# Patient Record
Sex: Male | Born: 1945 | Race: Black or African American | Hispanic: No | Marital: Married | Smoking: Never smoker
Health system: Southern US, Community
[De-identification: ages and names within clinical notes are randomized; demographics above are authoritative.]

## PROBLEM LIST (undated history)

## (undated) DIAGNOSIS — D649 Anemia, unspecified: Secondary | ICD-10-CM

## (undated) DIAGNOSIS — K7460 Unspecified cirrhosis of liver: Secondary | ICD-10-CM

## (undated) DIAGNOSIS — I639 Cerebral infarction, unspecified: Secondary | ICD-10-CM

## (undated) DIAGNOSIS — I4891 Unspecified atrial fibrillation: Secondary | ICD-10-CM

## (undated) DIAGNOSIS — I219 Acute myocardial infarction, unspecified: Secondary | ICD-10-CM

## (undated) DIAGNOSIS — I509 Heart failure, unspecified: Secondary | ICD-10-CM

## (undated) DIAGNOSIS — F419 Anxiety disorder, unspecified: Secondary | ICD-10-CM

## (undated) DIAGNOSIS — R569 Unspecified convulsions: Secondary | ICD-10-CM

## (undated) DIAGNOSIS — I2510 Atherosclerotic heart disease of native coronary artery without angina pectoris: Secondary | ICD-10-CM

## (undated) DIAGNOSIS — C61 Malignant neoplasm of prostate: Secondary | ICD-10-CM

## (undated) DIAGNOSIS — I10 Essential (primary) hypertension: Secondary | ICD-10-CM

## (undated) DIAGNOSIS — E119 Type 2 diabetes mellitus without complications: Secondary | ICD-10-CM

## (undated) DIAGNOSIS — N189 Chronic kidney disease, unspecified: Secondary | ICD-10-CM

## (undated) DIAGNOSIS — E7800 Pure hypercholesterolemia, unspecified: Secondary | ICD-10-CM

## (undated) DIAGNOSIS — D509 Iron deficiency anemia, unspecified: Secondary | ICD-10-CM

## (undated) HISTORY — PX: CATARACT EXTRACTION, BILATERAL: SHX1313

## (undated) HISTORY — PX: PROSTATE BIOPSY: SHX241

## (undated) HISTORY — DX: Iron deficiency anemia, unspecified: D50.9

## (undated) HISTORY — DX: Unspecified cirrhosis of liver: K74.60

## (undated) HISTORY — DX: Acute myocardial infarction, unspecified: I21.9

## (undated) HISTORY — PX: CORONARY ARTERY BYPASS GRAFT: SHX141

## (undated) HISTORY — PX: CARDIAC CATHETERIZATION: SHX172

---

## 2006-09-09 ENCOUNTER — Emergency Department (HOSPITAL_COMMUNITY): Admission: EM | Admit: 2006-09-09 | Discharge: 2006-09-09 | Payer: Self-pay | Admitting: Emergency Medicine

## 2009-01-30 ENCOUNTER — Emergency Department (HOSPITAL_COMMUNITY): Admission: EM | Admit: 2009-01-30 | Discharge: 2009-01-30 | Payer: Self-pay | Admitting: Emergency Medicine

## 2009-05-30 ENCOUNTER — Emergency Department (HOSPITAL_COMMUNITY): Admission: EM | Admit: 2009-05-30 | Discharge: 2009-05-30 | Payer: Self-pay | Admitting: Family Medicine

## 2009-05-30 ENCOUNTER — Emergency Department (HOSPITAL_COMMUNITY): Admission: EM | Admit: 2009-05-30 | Discharge: 2009-05-30 | Payer: Self-pay | Admitting: Emergency Medicine

## 2010-09-10 LAB — POCT URINALYSIS DIP (DEVICE)
Bilirubin Urine: NEGATIVE
Ketones, ur: NEGATIVE mg/dL
Nitrite: NEGATIVE

## 2010-09-10 LAB — GLUCOSE, CAPILLARY: Glucose-Capillary: 196 mg/dL — ABNORMAL HIGH (ref 70–99)

## 2010-09-10 LAB — URINALYSIS, ROUTINE W REFLEX MICROSCOPIC
Bilirubin Urine: NEGATIVE
Ketones, ur: NEGATIVE mg/dL
Nitrite: NEGATIVE
Protein, ur: 30 mg/dL — AB
Specific Gravity, Urine: 1.014 (ref 1.005–1.030)
Urobilinogen, UA: 0.2 mg/dL (ref 0.0–1.0)

## 2010-09-10 LAB — CBC
HCT: 41.3 % (ref 39.0–52.0)
Hemoglobin: 13.8 g/dL (ref 13.0–17.0)
Platelets: 172 10*3/uL (ref 150–400)
RDW: 15.6 % — ABNORMAL HIGH (ref 11.5–15.5)
WBC: 4.2 10*3/uL (ref 4.0–10.5)

## 2010-09-10 LAB — DIFFERENTIAL
Basophils Absolute: 0 10*3/uL (ref 0.0–0.1)
Basophils Relative: 0 % (ref 0–1)
Eosinophils Absolute: 0.1 10*3/uL (ref 0.0–0.7)
Monocytes Absolute: 0.3 10*3/uL (ref 0.1–1.0)
Monocytes Relative: 6 % (ref 3–12)
Neutro Abs: 2.9 10*3/uL (ref 1.7–7.7)

## 2010-09-10 LAB — COMPREHENSIVE METABOLIC PANEL
ALT: 43 U/L (ref 0–53)
Albumin: 3.4 g/dL — ABNORMAL LOW (ref 3.5–5.2)
Alkaline Phosphatase: 145 U/L — ABNORMAL HIGH (ref 39–117)
BUN: 18 mg/dL (ref 6–23)
Chloride: 104 mEq/L (ref 96–112)
Glucose, Bld: 188 mg/dL — ABNORMAL HIGH (ref 70–99)
Potassium: 4.1 mEq/L (ref 3.5–5.1)
Sodium: 140 mEq/L (ref 135–145)
Total Bilirubin: 0.6 mg/dL (ref 0.3–1.2)
Total Protein: 7.4 g/dL (ref 6.0–8.3)

## 2010-09-15 LAB — URINALYSIS, ROUTINE W REFLEX MICROSCOPIC
Glucose, UA: NEGATIVE mg/dL
Protein, ur: NEGATIVE mg/dL
Specific Gravity, Urine: 1.014 (ref 1.005–1.030)
Urobilinogen, UA: 0.2 mg/dL (ref 0.0–1.0)

## 2010-09-15 LAB — DIFFERENTIAL
Basophils Absolute: 0 10*3/uL (ref 0.0–0.1)
Basophils Relative: 1 % (ref 0–1)
Lymphocytes Relative: 25 % (ref 12–46)
Monocytes Relative: 9 % (ref 3–12)
Neutro Abs: 2.9 10*3/uL (ref 1.7–7.7)
Neutrophils Relative %: 63 % (ref 43–77)

## 2010-09-15 LAB — CBC
HCT: 32.1 % — ABNORMAL LOW (ref 39.0–52.0)
Hemoglobin: 11.2 g/dL — ABNORMAL LOW (ref 13.0–17.0)
MCHC: 35 g/dL (ref 30.0–36.0)
MCV: 86.6 fL (ref 78.0–100.0)
Platelets: 133 10*3/uL — ABNORMAL LOW (ref 150–400)
RDW: 14.3 % (ref 11.5–15.5)

## 2010-09-15 LAB — COMPREHENSIVE METABOLIC PANEL
Albumin: 4 g/dL (ref 3.5–5.2)
Alkaline Phosphatase: 91 U/L (ref 39–117)
BUN: 74 mg/dL — ABNORMAL HIGH (ref 6–23)
Calcium: 9.7 mg/dL (ref 8.4–10.5)
Creatinine, Ser: 2.93 mg/dL — ABNORMAL HIGH (ref 0.4–1.5)
Glucose, Bld: 201 mg/dL — ABNORMAL HIGH (ref 70–99)
Potassium: 4.3 mEq/L (ref 3.5–5.1)
Total Protein: 8.5 g/dL — ABNORMAL HIGH (ref 6.0–8.3)

## 2010-09-15 LAB — GLUCOSE, CAPILLARY: Glucose-Capillary: 172 mg/dL — ABNORMAL HIGH (ref 70–99)

## 2010-10-23 NOTE — Consult Note (Signed)
NAMEMarland Kitchen  JENSEN, KILBURG NO.:  [id number]   MEDICAL RECORD NO.:  [id number]          PATIENT TYPE:  EMS   LOCATION:  [facility]                         FACILITY:  [facility]   PHYSICIAN:  Vania Rea, M.D. DATE OF BIRTH:  1946-05-31   DATE OF CONSULTATION:  01/30/2009  DATE OF DISCHARGE:                                 CONSULTATION   PHYSICIAN REQUESTING CONSULTATION:  Guy Franco, PA-C.   PRIMARY CARE PHYSICIAN:  Vida Rigger, MD, at the [facility] in [place].   REASON FOR CONSULTATION:  Dizziness and new onset atrial fibrillation.   IMPRESSION:  1. Chronic atrial fibrillation not on Coumadin therapy because of      chronic dizziness.  2. History of a posterior cerebrovascular accident with chronic      balance impairment.  3. Diabetes type 2.  4. Congestive heart failure.  5. Coronary artery disease status post coronary artery bypass      grafting.  6. Hypertension, controlled.  7. History of hyperlipidemia.  8. History of seizure disorder.  9. Chronic renal failure.   RECOMMENDATIONS:  Since patient's medical problems appear to be all  chronic and since [facility] and we are  unable to [facility] to [facility] at this time, I have  recommended discharge the patient home without any change in his  management and advised the patient to follow up with the [facility] as soon as possible as this evening or tomorrow morning.   HISTORY OF PRESENT ILLNESS:  This is a 65 year old African American  gentleman with a past history of a stroke, which he says was in the  posterior part of his brain, a history of chronic kidney disease,  chronic atrial fibrillation, diabetes, and congestive heart failure, who  lives alone but is closely cared for by his daughter, Jex Strausbaugh, as  well as his wife, who looks in on him.  The patient usually gets around  with the assistance of a cane or a walker, although  we suspect this is  due to stubbornness rather than a real ability.  His daughter reports  that patient suffers with chronic balance impairment but got  increasingly dizzy on Friday when he went into his house before the [facility]  was switched on.  The house was very hot and he became very dizzy.  She  took him around to his house for awhile until his house cooled down and  he has improved.  She also called his kidney doctor, who made some  adjustments to his medication and she feels he is improved, but his wife  brought him to the hospital today because she was concerned.  Daughter,  however, does not feel there has been a great change in this patient  over the past from his baseline.   Patient is speech-impaired because of his stroke and also has cognition  difficulties so communication is very slow and deliberate and therefore  assisted by his wife and daughter.  The patient denies any headache,  chest pain, nausea, vomiting,  vertigo, diarrhea, fever, or chills.  He  says the dizziness does not stop him from ambulating.  He simply holds  on and walks around.  He does not feel as if he is going to pass out.   PAST MEDICAL HISTORY:  As noted above.   MEDICATIONS:  1. Minoxidil 10 mg 5 tablets twice daily.  2. Toprol-XL of 200 mg half a tablet daily.  3. Simvastatin 80 mg 1 tablet at bedtime.  4. Iron sulfate 325 mg 1 tablet daily.  5. Colace 100 mg 1 capsule twice daily.  6. Aspirin 81 mg daily.  7. Amlodipine 10 mg daily.  8. Spironolactone 25 mg 1 tablet daily.  9. Losartan 100 mg 1 tablet daily.  10.Lisinopril 40 mg 1 tablet twice daily.  11.Novolin 70/30, 30 units each morning.  12.Furosemide 40 mg, half to 1 tablet twice daily.  13.Vitamin D 50,000 units monthly.  14.Carbamazepine 300 mg 2 capsules twice daily.  15.Timolol/dorzolamide ophthalmic drops 0.5/2%, one drop daily.  16.Travoprost-Z ophthalmic solution in each eye 1 drop at bedtime.  17.Brimonidine ophthalmic solution  in each eye 1 drop daily.  18.Artificial tears in each eye at 1 drop 4 times daily.  19.Dermacerin topical cream, apply twice daily as needed for dry skin.   ALLERGIES:  No known drug allergies.   SOCIAL HISTORY:  No history of alcohol, tobacco, or illicit drug use.  He is a [facility] connected.   REVIEW OF SYSTEMS:  Other than noted above, a 10-point review of systems  was unremarkable.   PHYSICAL EXAMINATION:  GENERAL:  A very pleasant, 65-year-old, African  American gentleman sitting up in bed in no acute distress.  VITAL SIGNS:  His temperature is 98.0.  His pulse is 82 and irregular.  His  respiration is 18.  His blood pressure is 115/71.  He is saturating at  98% on room air.  HEENT:  His pupils are round and equal.  Mucous membranes pink,  anicteric.  NECK:  No cervical lymphadenopathy or thyromegaly.  CHEST:  Clear to auscultation bilaterally.  CARDIOVASCULAR:  Irregularly irregular rhythm.  He is not tachycardiac.  ABDOMEN:  Soft and nontender.  There are no masses.  EXTREMITIES:  He has a trace edema bilaterally.   LABORATORY DATA:  His white count is 4.7, hemoglobin 11.2, platelets  133.  He has a normal differential on his white count.  His sodium is  138, potassium 4.3, chloride 105, CO2 26, glucose is 201, BUN 74,  creatinine 2.93, calcium 9.7.  Total protein 8.5, albumin 4.0, liver  function is unremarkable.  A fingerstick blood glucose 172.  A 2-  view chest x-ray shows mild cardiomegaly, clear lungs, and no evidence  of pleural effusion, no mass or adenopathy, no active lung disease.  Urinalysis shows clear urine with a specific gravity of 1.014 and  negative for proteins ketones, nitrites, or leukocyte esterase      Vania Rea, M.D.  Electronically Signed     LC/MEDQ  D:  01/30/2009  T:  01/30/2009  Job:  [id number]   cc:   Cipriano Mile, M.D.  Guy Franco, PA-C

## 2012-05-22 ENCOUNTER — Emergency Department (HOSPITAL_COMMUNITY): Payer: Non-veteran care

## 2012-05-22 ENCOUNTER — Inpatient Hospital Stay (HOSPITAL_COMMUNITY)
Admission: EM | Admit: 2012-05-22 | Discharge: 2012-05-24 | DRG: 871 | Disposition: A | Discharge status: Home / Self Care | Payer: Non-veteran care | Attending: Internal Medicine | Admitting: Internal Medicine

## 2012-05-22 ENCOUNTER — Other Ambulatory Visit: Payer: Self-pay

## 2012-05-22 ENCOUNTER — Encounter (HOSPITAL_COMMUNITY): Payer: Self-pay | Admitting: Emergency Medicine

## 2012-05-22 DIAGNOSIS — I9589 Other hypotension: Secondary | ICD-10-CM | POA: Diagnosis not present

## 2012-05-22 DIAGNOSIS — I129 Hypertensive chronic kidney disease with stage 1 through stage 4 chronic kidney disease, or unspecified chronic kidney disease: Secondary | ICD-10-CM | POA: Diagnosis present

## 2012-05-22 DIAGNOSIS — Z7982 Long term (current) use of aspirin: Secondary | ICD-10-CM

## 2012-05-22 DIAGNOSIS — I10 Essential (primary) hypertension: Secondary | ICD-10-CM

## 2012-05-22 DIAGNOSIS — T502X5A Adverse effect of carbonic-anhydrase inhibitors, benzothiadiazides and other diuretics, initial encounter: Secondary | ICD-10-CM | POA: Diagnosis not present

## 2012-05-22 DIAGNOSIS — J189 Pneumonia, unspecified organism: Secondary | ICD-10-CM

## 2012-05-22 DIAGNOSIS — I69928 Other speech and language deficits following unspecified cerebrovascular disease: Secondary | ICD-10-CM

## 2012-05-22 DIAGNOSIS — J1100 Influenza due to unidentified influenza virus with unspecified type of pneumonia: Secondary | ICD-10-CM | POA: Diagnosis present

## 2012-05-22 DIAGNOSIS — A419 Sepsis, unspecified organism: Principal | ICD-10-CM

## 2012-05-22 DIAGNOSIS — I509 Heart failure, unspecified: Secondary | ICD-10-CM

## 2012-05-22 DIAGNOSIS — I2510 Atherosclerotic heart disease of native coronary artery without angina pectoris: Secondary | ICD-10-CM | POA: Diagnosis present

## 2012-05-22 DIAGNOSIS — Z951 Presence of aortocoronary bypass graft: Secondary | ICD-10-CM

## 2012-05-22 DIAGNOSIS — Z79899 Other long term (current) drug therapy: Secondary | ICD-10-CM

## 2012-05-22 DIAGNOSIS — N289 Disorder of kidney and ureter, unspecified: Secondary | ICD-10-CM

## 2012-05-22 DIAGNOSIS — R0600 Dyspnea, unspecified: Secondary | ICD-10-CM

## 2012-05-22 DIAGNOSIS — E119 Type 2 diabetes mellitus without complications: Secondary | ICD-10-CM | POA: Diagnosis present

## 2012-05-22 DIAGNOSIS — N189 Chronic kidney disease, unspecified: Secondary | ICD-10-CM | POA: Diagnosis present

## 2012-05-22 HISTORY — DX: Atherosclerotic heart disease of native coronary artery without angina pectoris: I25.10

## 2012-05-22 HISTORY — DX: Unspecified atrial fibrillation: I48.91

## 2012-05-22 HISTORY — DX: Cerebral infarction, unspecified: I63.9

## 2012-05-22 HISTORY — DX: Essential (primary) hypertension: I10

## 2012-05-22 LAB — COMPREHENSIVE METABOLIC PANEL
AST: 33 U/L (ref 0–37)
Albumin: 3.4 g/dL — ABNORMAL LOW (ref 3.5–5.2)
CO2: 21 mEq/L (ref 19–32)
Calcium: 9.7 mg/dL (ref 8.4–10.5)
Creatinine, Ser: 2.12 mg/dL — ABNORMAL HIGH (ref 0.50–1.35)
GFR calc non Af Amer: 31 mL/min — ABNORMAL LOW (ref >90)
Sodium: 138 mEq/L (ref 135–145)
Total Protein: 7.7 g/dL (ref 6.0–8.3)

## 2012-05-22 MED ORDER — SODIUM CHLORIDE 0.9 % IV BOLUS (SEPSIS)
1000.0000 mL | Freq: Once | INTRAVENOUS | Status: AC
  Administered 2012-05-22: 1000 mL via INTRAVENOUS

## 2012-05-22 NOTE — ED Provider Notes (Signed)
History     CSN: [id number]  Arrival date & time 05/22/12  2220   First MD Initiated Contact with Patient 05/22/12 2231      Chief Complaint  Patient presents with  . Chest Pain    (Consider location/radiation/quality/duration/timing/severity/associated sxs/prior treatment) Patient is a 66 y.o. male presenting with chest pain. The history is provided by the patient.  Chest Pain Primary symptoms include a fever, fatigue, shortness of breath and cough. Pertinent negatives for primary symptoms include no abdominal pain, no nausea and no vomiting.  Associated symptoms include weakness.  Pertinent negatives for associated symptoms include no numbness.    patient presents with fever chest pain shortness of breath.patient was given Tylenol at home. He was shaky yesterday. He has some difficulty speaking due to previous stroke. Has a history of atrial fibrillation. He also has had some urinary frequency.   Past Medical History  Diagnosis Date  . Stroke   . Diabetes mellitus without complication   . Hypertension   . Coronary artery disease     Past Surgical History  Procedure Date  . Coronary artery bypass graft     No family history on file.  History  Substance Use Topics  . Smoking status: Never Smoker   . Smokeless tobacco: Not on file  . Alcohol Use: No      Review of Systems  Constitutional: Positive for fever, appetite change and fatigue. Negative for activity change.  HENT: Negative for neck stiffness.   Eyes: Negative for pain.  Respiratory: Positive for cough and shortness of breath. Negative for chest tightness.   Cardiovascular: Positive for chest pain. Negative for leg swelling.  Gastrointestinal: Negative for nausea, vomiting, abdominal pain and diarrhea.  Genitourinary: Negative for flank pain.  Musculoskeletal: Negative for back pain.  Skin: Negative for rash.  Neurological: Positive for weakness. Negative for numbness and headaches.   Psychiatric/Behavioral: Negative for behavioral problems.    Allergies  Review of patient's allergies indicates no known allergies.  Home Medications  No current outpatient prescriptions on file.  BP 178/109  Pulse 126  Temp 100.7 F (38.2 C) (Oral)  Resp 18  Wt 225 lb (102.059 kg)  SpO2 92%  Physical Exam  Nursing note and vitals reviewed. Constitutional: He is oriented to person, place, and time. He appears well-developed and well-nourished.  HENT:  Head: Normocephalic and atraumatic.  Eyes: EOM are normal. Pupils are equal, round, and reactive to light.  Neck: Normal range of motion. Neck supple.  Cardiovascular: Regular rhythm and normal heart sounds.   No murmur heard.      Tachycardia.   Pulmonary/Chest: Effort normal and breath sounds normal.       Mildly harsh breath sounds throughout. Tachypnea.   Abdominal: Soft. Bowel sounds are normal. He exhibits no distension and no mass. There is no tenderness. There is no rebound and no guarding.  Musculoskeletal: Normal range of motion. He exhibits no edema.  Neurological: He is alert and oriented to person, place, and time. No cranial nerve deficit.       Chronic left sided weakness.   Skin: Skin is warm and dry.  Psychiatric: He has a normal mood and affect.    ED Course  Procedures (including critical care time)  Labs Reviewed  GLUCOSE, CAPILLARY - Abnormal; Notable for the following:    Glucose-Capillary 256 (*)     All other components within normal limits  LACTIC ACID, PLASMA  COMPREHENSIVE METABOLIC PANEL  URINALYSIS, ROUTINE W REFLEX MICROSCOPIC  URINE CULTURE  CULTURE, BLOOD (ROUTINE X 2)  CULTURE, BLOOD (ROUTINE X 2)  TROPONIN I  CARBAMAZEPINE LEVEL, TOTAL  CBC WITH DIFFERENTIAL   Dg Chest Port 1 View  05/22/2012  *RADIOLOGY REPORT*  Clinical Data: Fever and cough.  PORTABLE CHEST - 1 VIEW  Comparison: 05/30/2009  Findings: Stable appearance of postoperative changes in the mediastinum.  Shallow  inspiration.  Cardiac enlargement similar previous study.  There is development of mild pulmonary vascular congestion without edema.  No blunting of costophrenic angles.  No pneumothorax.  No focal consolidation.  Mediastinal contours appear intact.  Calcification of the aorta.  IMPRESSION: Shallow inspiration.  Cardiac enlargement with mild pulmonary vascular congestion.   Original Report Authenticated By: Burman Nieves, M.D.      No diagnosis found.   Date: 05/22/2012  Rate: 125  Rhythm: atrial fibrillation  QRS Axis: normal  Intervals: afib  ST/T Wave abnormalities: nonspecific ST/T changes  Conduction Disutrbances:none  Narrative Interpretation: nonspecific ST/T Changes, tachycardia.   Old EKG Reviewed: changes noted    MDM  Patient with shortness of breath and fever. Tachypnea and cough. Xray no pneumonia. Lab work pending.   Juliet Rude. Rubin Payor, MD 05/23/12 0003

## 2012-05-22 NOTE — ED Notes (Signed)
Pt not able to urinate.  Will try again in 30 minutes. 

## 2012-05-22 NOTE — ED Notes (Signed)
Family states pt was given adult dose Tylenol PTA

## 2012-05-22 NOTE — ED Notes (Signed)
Pt alert, arrives from home, c/o chest pain and SOB, onset was several days ago, per family "he was shaky" yesterday, resp even unlabored, moist NPC noted

## 2012-05-22 NOTE — ED Notes (Signed)
IV attempted times two,  Unsucessful,  Electrical engineer charge at bedside

## 2012-05-22 NOTE — ED Notes (Signed)
Autumn RN at bedside attempting IV access

## 2012-05-23 ENCOUNTER — Encounter (HOSPITAL_COMMUNITY): Payer: Self-pay | Admitting: Internal Medicine

## 2012-05-23 DIAGNOSIS — J189 Pneumonia, unspecified organism: Secondary | ICD-10-CM

## 2012-05-23 DIAGNOSIS — N289 Disorder of kidney and ureter, unspecified: Secondary | ICD-10-CM | POA: Diagnosis present

## 2012-05-23 DIAGNOSIS — R0989 Other specified symptoms and signs involving the circulatory and respiratory systems: Secondary | ICD-10-CM

## 2012-05-23 DIAGNOSIS — I509 Heart failure, unspecified: Secondary | ICD-10-CM

## 2012-05-23 DIAGNOSIS — A419 Sepsis, unspecified organism: Principal | ICD-10-CM

## 2012-05-23 LAB — HEMOGLOBIN A1C: Hgb A1c MFr Bld: 7.8 % — ABNORMAL HIGH (ref <5.7)

## 2012-05-23 LAB — GLUCOSE, CAPILLARY
Glucose-Capillary: 271 mg/dL — ABNORMAL HIGH (ref 70–99)
Glucose-Capillary: 124 mg/dL — ABNORMAL HIGH (ref 70–99)

## 2012-05-23 LAB — CBC
Platelets: 164 10*3/uL (ref 150–400)
RBC: 4.41 MIL/uL (ref 4.22–5.81)
WBC: 6.1 10*3/uL (ref 4.0–10.5)

## 2012-05-23 LAB — URINE MICROSCOPIC-ADD ON

## 2012-05-23 LAB — URINALYSIS, ROUTINE W REFLEX MICROSCOPIC
Specific Gravity, Urine: 1.025 (ref 1.005–1.030)
Urobilinogen, UA: 0.2 mg/dL (ref 0.0–1.0)
pH: 5.5 (ref 5.0–8.0)

## 2012-05-23 LAB — INFLUENZA PANEL BY PCR (TYPE A & B): Influenza A By PCR: POSITIVE — AB

## 2012-05-23 MED ORDER — POLYVINYL ALCOHOL 1.4 % OP SOLN
1.0000 [drp] | Freq: Four times a day (QID) | OPHTHALMIC | Status: DC
  Administered 2012-05-23 – 2012-05-24 (×5): 1 [drp] via OPHTHALMIC
  Filled 2012-05-23: qty 15

## 2012-05-23 MED ORDER — DOCUSATE SODIUM 100 MG PO CAPS
100.0000 mg | ORAL_CAPSULE | Freq: Two times a day (BID) | ORAL | Status: DC | PRN
  Filled 2012-05-23: qty 1

## 2012-05-23 MED ORDER — GLUCERNA SHAKE PO LIQD
237.0000 mL | Freq: Three times a day (TID) | ORAL | Status: DC
  Administered 2012-05-23 – 2012-05-24 (×4): 237 mL via ORAL
  Filled 2012-05-23 (×5): qty 237

## 2012-05-23 MED ORDER — DEXTROSE 50 % IV SOLN: 25.0000 mL | INTRAVENOUS | Status: DC | PRN

## 2012-05-23 MED ORDER — HEPARIN SODIUM (PORCINE) 5000 UNIT/ML IJ SOLN
5000.0000 [IU] | Freq: Three times a day (TID) | INTRAMUSCULAR | Status: DC
  Administered 2012-05-23 – 2012-05-24 (×5): 5000 [IU] via SUBCUTANEOUS
  Filled 2012-05-23 (×7): qty 1

## 2012-05-23 MED ORDER — DEXTROSE-NACL 5-0.45 % IV SOLN: INTRAVENOUS | Status: DC

## 2012-05-23 MED ORDER — DEXTROSE 5 % IV SOLN
1.0000 g | Freq: Every day | INTRAVENOUS | Status: DC
  Administered 2012-05-23: 1 g via INTRAVENOUS
  Filled 2012-05-23 (×2): qty 10

## 2012-05-23 MED ORDER — LATANOPROST 0.005 % OP SOLN
1.0000 [drp] | Freq: Every day | OPHTHALMIC | Status: DC
  Administered 2012-05-23: 1 [drp] via OPHTHALMIC
  Filled 2012-05-23: qty 2.5

## 2012-05-23 MED ORDER — CARBAMAZEPINE ER 100 MG PO TB12
300.0000 mg | ORAL_TABLET | Freq: Two times a day (BID) | ORAL | Status: DC
  Administered 2012-05-23 – 2012-05-24 (×3): 300 mg via ORAL
  Filled 2012-05-23 (×4): qty 3

## 2012-05-23 MED ORDER — SODIUM CHLORIDE 0.9 % IJ SOLN: 3.0000 mL | INTRAMUSCULAR | Status: DC | PRN

## 2012-05-23 MED ORDER — CALCITRIOL 0.25 MCG PO CAPS
0.2500 ug | ORAL_CAPSULE | Freq: Every morning | ORAL | Status: DC
  Administered 2012-05-23 – 2012-05-24 (×2): 0.25 ug via ORAL
  Filled 2012-05-23 (×2): qty 1

## 2012-05-23 MED ORDER — SODIUM CHLORIDE 0.9 % IV SOLN: INTRAVENOUS | Status: DC

## 2012-05-23 MED ORDER — SODIUM CHLORIDE 0.9 % IJ SOLN
3.0000 mL | Freq: Two times a day (BID) | INTRAMUSCULAR | Status: DC
  Administered 2012-05-23 (×2): 3 mL via INTRAVENOUS

## 2012-05-23 MED ORDER — INSULIN ASPART PROT & ASPART (70-30 MIX) 100 UNIT/ML ~~LOC~~ SUSP
10.0000 [IU] | Freq: Every day | SUBCUTANEOUS | Status: DC
  Administered 2012-05-23 – 2012-05-24 (×2): 10 [IU] via SUBCUTANEOUS

## 2012-05-23 MED ORDER — INSULIN ASPART PROT & ASPART (70-30 MIX) 100 UNIT/ML ~~LOC~~ SUSP
30.0000 [IU] | Freq: Every day | SUBCUTANEOUS | Status: DC
  Administered 2012-05-23 – 2012-05-24 (×2): 30 [IU] via SUBCUTANEOUS
  Filled 2012-05-23: qty 3

## 2012-05-23 MED ORDER — OSELTAMIVIR PHOSPHATE 75 MG PO CAPS
75.0000 mg | ORAL_CAPSULE | Freq: Two times a day (BID) | ORAL | Status: DC
  Administered 2012-05-23 – 2012-05-24 (×3): 75 mg via ORAL
  Filled 2012-05-23 (×4): qty 1

## 2012-05-23 MED ORDER — HYDRALAZINE HCL 20 MG/ML IJ SOLN
10.0000 mg | Freq: Four times a day (QID) | INTRAMUSCULAR | Status: DC | PRN
  Administered 2012-05-23: 10 mg via INTRAVENOUS
  Filled 2012-05-23: qty 1

## 2012-05-23 MED ORDER — SODIUM CHLORIDE 0.9 % IJ SOLN
3.0000 mL | Freq: Two times a day (BID) | INTRAMUSCULAR | Status: DC
  Administered 2012-05-23 – 2012-05-24 (×2): 3 mL via INTRAVENOUS

## 2012-05-23 MED ORDER — LOSARTAN POTASSIUM 50 MG PO TABS
100.0000 mg | ORAL_TABLET | Freq: Every morning | ORAL | Status: DC
  Administered 2012-05-23 – 2012-05-24 (×2): 100 mg via ORAL
  Filled 2012-05-23 (×2): qty 2

## 2012-05-23 MED ORDER — FUROSEMIDE 40 MG PO TABS
40.0000 mg | ORAL_TABLET | Freq: Every morning | ORAL | Status: DC
  Filled 2012-05-23: qty 1

## 2012-05-23 MED ORDER — BIOTENE DRY MOUTH MT LIQD
15.0000 mL | Freq: Two times a day (BID) | OROMUCOSAL | Status: DC
  Administered 2012-05-23 – 2012-05-24 (×3): 15 mL via OROMUCOSAL

## 2012-05-23 MED ORDER — INSULIN REGULAR BOLUS VIA INFUSION: 0.0000 [IU] | Freq: Three times a day (TID) | INTRAVENOUS | Status: DC

## 2012-05-23 MED ORDER — INSULIN ASPART 100 UNIT/ML ~~LOC~~ SOLN
0.0000 [IU] | Freq: Three times a day (TID) | SUBCUTANEOUS | Status: DC
  Administered 2012-05-23: 2 [IU] via SUBCUTANEOUS
  Administered 2012-05-23: 18:00:00 via SUBCUTANEOUS
  Administered 2012-05-23: 5 [IU] via SUBCUTANEOUS
  Administered 2012-05-24: 1 [IU] via SUBCUTANEOUS
  Administered 2012-05-24: 3 [IU] via SUBCUTANEOUS
  Administered 2012-05-24: 2 [IU] via SUBCUTANEOUS

## 2012-05-23 MED ORDER — ACETAMINOPHEN 325 MG PO TABS
650.0000 mg | ORAL_TABLET | Freq: Four times a day (QID) | ORAL | Status: DC | PRN
  Administered 2012-05-23 (×2): 650 mg via ORAL
  Filled 2012-05-23 (×2): qty 2

## 2012-05-23 MED ORDER — CLONIDINE HCL 0.2 MG/24HR TD PTWK: 0.2000 mg | MEDICATED_PATCH | TRANSDERMAL | Status: DC

## 2012-05-23 MED ORDER — FUROSEMIDE 10 MG/ML IJ SOLN
40.0000 mg | Freq: Two times a day (BID) | INTRAMUSCULAR | Status: DC
  Administered 2012-05-23 – 2012-05-24 (×3): 40 mg via INTRAVENOUS
  Filled 2012-05-23 (×5): qty 4

## 2012-05-23 MED ORDER — HYDRALAZINE HCL 25 MG PO TABS
25.0000 mg | ORAL_TABLET | Freq: Three times a day (TID) | ORAL | Status: DC
  Administered 2012-05-23 – 2012-05-24 (×4): 25 mg via ORAL
  Filled 2012-05-23 (×6): qty 1

## 2012-05-23 MED ORDER — ATROPINE SULFATE 1 % OP SOLN
1.0000 [drp] | Freq: Two times a day (BID) | OPHTHALMIC | Status: DC
  Administered 2012-05-23 – 2012-05-24 (×3): 1 [drp] via OPHTHALMIC
  Filled 2012-05-23: qty 2

## 2012-05-23 MED ORDER — DORZOLAMIDE HCL-TIMOLOL MAL 2-0.5 % OP SOLN
1.0000 [drp] | Freq: Every day | OPHTHALMIC | Status: DC
  Administered 2012-05-23: 1 [drp] via OPHTHALMIC
  Filled 2012-05-23: qty 10

## 2012-05-23 MED ORDER — SODIUM CHLORIDE 0.9 % IV SOLN: 250.0000 mL | INTRAVENOUS | Status: DC | PRN

## 2012-05-23 MED ORDER — DEXTROSE 5 % IV SOLN
1.0000 g | Freq: Once | INTRAVENOUS | Status: AC
  Administered 2012-05-23: 1 g via INTRAVENOUS
  Filled 2012-05-23: qty 10

## 2012-05-23 MED ORDER — ATORVASTATIN CALCIUM 20 MG PO TABS
20.0000 mg | ORAL_TABLET | Freq: Every day | ORAL | Status: DC
  Administered 2012-05-23 – 2012-05-24 (×2): 20 mg via ORAL
  Filled 2012-05-23 (×2): qty 1

## 2012-05-23 MED ORDER — ASPIRIN 81 MG PO CHEW
81.0000 mg | CHEWABLE_TABLET | Freq: Every morning | ORAL | Status: DC
  Administered 2012-05-23 – 2012-05-24 (×2): 81 mg via ORAL
  Filled 2012-05-23 (×2): qty 1

## 2012-05-23 MED ORDER — METOPROLOL SUCCINATE ER 100 MG PO TB24
200.0000 mg | ORAL_TABLET | Freq: Every morning | ORAL | Status: DC
  Administered 2012-05-23 – 2012-05-24 (×2): 200 mg via ORAL
  Filled 2012-05-23 (×2): qty 2

## 2012-05-23 MED ORDER — BRIMONIDINE TARTRATE 0.2 % OP SOLN
1.0000 [drp] | Freq: Three times a day (TID) | OPHTHALMIC | Status: DC
  Administered 2012-05-23 – 2012-05-24 (×5): 1 [drp] via OPHTHALMIC
  Filled 2012-05-23: qty 5

## 2012-05-23 MED ORDER — DEXTROSE 5 % IV SOLN
500.0000 mg | Freq: Once | INTRAVENOUS | Status: AC
  Administered 2012-05-23: 500 mg via INTRAVENOUS
  Filled 2012-05-23: qty 500

## 2012-05-23 MED ORDER — DEXTROSE 5 % IV SOLN
500.0000 mg | Freq: Every day | INTRAVENOUS | Status: DC
  Administered 2012-05-23: 500 mg via INTRAVENOUS
  Filled 2012-05-23 (×2): qty 500

## 2012-05-23 MED ORDER — SODIUM CHLORIDE 0.9 % IV SOLN
INTRAVENOUS | Status: DC
  Administered 2012-05-23: 02:00:00 via INTRAVENOUS

## 2012-05-23 MED ORDER — AMLODIPINE BESYLATE 10 MG PO TABS
10.0000 mg | ORAL_TABLET | Freq: Every morning | ORAL | Status: DC
  Administered 2012-05-23 – 2012-05-24 (×2): 10 mg via ORAL
  Filled 2012-05-23 (×2): qty 1

## 2012-05-23 MED ORDER — SENNA 8.6 MG PO TABS: 1.0000 | ORAL_TABLET | Freq: Every day | ORAL | Status: DC | PRN

## 2012-05-23 MED ORDER — SPIRONOLACTONE 50 MG PO TABS
50.0000 mg | ORAL_TABLET | Freq: Two times a day (BID) | ORAL | Status: DC
  Administered 2012-05-23 – 2012-05-24 (×3): 50 mg via ORAL
  Filled 2012-05-23 (×4): qty 1

## 2012-05-23 NOTE — ED Notes (Signed)
Report to Fran RN.

## 2012-05-23 NOTE — H&P (Signed)
[facility] History and Physical  Collin Fox ZOX:[id number] DOB: [birth year] DOA: 05/22/2012  Referring physician: ED PCP: Nada Boozer, MD  Specialists: None  Chief Complaint: Shortness of breath  HPI: Collin Fox is a 66 y.o. male who presents with c/o cough, fever, chills, fatigue, SOB.  Symptoms onset 2 days ago, cough is productive and frequent.  He does have some difficulty with speaking due to a prior stroke, family states mental status is at baseline.  Nothing improves nor worsens his symptoms.  In the ED he was found to be febrile, tachycardic, tachypnic, CXR demonstrated pulmonary vascular congestion but no obvious infiltrate.  Hospitalist has been asked to admit.  Review of Systems: positive for occasional leg swelling though not that bad today, full body aches, recent sick contact (wife with URI and productive cough), 12 systems reviewed and otherwise negative.  Past Medical History  Diagnosis Date  . Stroke   . Diabetes mellitus without complication   . Hypertension   . Coronary artery disease    Past Surgical History  Procedure Date  . Coronary artery bypass graft    Social History:  reports that he has never smoked. He does not have any smokeless tobacco history on file. He reports that he does not drink alcohol. His drug history not on file.   No Known Allergies  No family history on file.  Prior to Admission medications   Medication Sig Start Date End Date Taking? Authorizing Provider  amLODipine (NORVASC) 10 MG tablet Take 10 mg by mouth every morning.   Yes Historical Provider, MD  aspirin 81 MG chewable tablet Chew 81 mg by mouth every morning.   Yes Historical Provider, MD  atropine 1 % ophthalmic solution Place 1 drop into the right eye 2 (two) times daily.    Yes Historical Provider, MD  brimonidine (ALPHAGAN) 0.2 % ophthalmic solution Place 1 drop into both eyes 3 (three) times daily.   Yes Historical Provider, MD  calcitRIOL  (ROCALTROL) 0.25 MCG capsule Take 0.25 mcg by mouth every morning.   Yes Historical Provider, MD  carbamazepine (CARBATROL) 300 MG 12 hr capsule Take 300 mg by mouth 2 (two) times daily.   Yes Historical Provider, MD  cloNIDine (CATAPRES - DOSED IN MG/24 HR) 0.2 mg/24hr patch Place 1 patch onto the skin once a week. For high blood pressure. Change patch every Wednesday   Yes Historical Provider, MD  docusate sodium (COLACE) 100 MG capsule Take 100 mg by mouth 2 (two) times daily as needed. For constipation   Yes Historical Provider, MD  dorzolamide-timolol (COSOPT) 22.3-6.8 MG/ML ophthalmic solution Place 1 drop into both eyes daily.   Yes Historical Provider, MD  furosemide (LASIX) 40 MG tablet Take 40 mg by mouth every morning.   Yes Historical Provider, MD  GARLIC PO Take 1 tablet by mouth every morning.   Yes Historical Provider, MD  hydrALAZINE (APRESOLINE) 25 MG tablet Take 25 mg by mouth 3 (three) times daily.   Yes Historical Provider, MD  hydrocerin (EUCERIN) CREA Apply 1 application topically 2 (two) times daily as needed. For dry skin   Yes Historical Provider, MD  insulin NPH-insulin regular (NOVOLIN 70/30) (70-30) 100 UNIT/ML injection Inject 10-30 Units into the skin 2 (two) times daily with a meal. Inject 30 units with breakfast and 10 units with supper   Yes Historical Provider, MD  latanoprost (XALATAN) 0.005 % ophthalmic solution Place 1 drop into both eyes at bedtime.   Yes Historical Provider, MD  losartan (COZAAR) 100 MG tablet Take 100 mg by mouth every morning.   Yes Historical Provider, MD  metoprolol (TOPROL-XL) 200 MG 24 hr tablet Take 200 mg by mouth every morning.   Yes Historical Provider, MD  polyvinyl alcohol (LIQUIFILM TEARS) 1.4 % ophthalmic solution Place 1 drop into both eyes 4 (four) times daily.   Yes Historical Provider, MD  rosuvastatin (CRESTOR) 20 MG tablet Take 10 mg by mouth at bedtime.   Yes Historical Provider, MD  senna (SENOKOT) 8.6 MG TABS Take 1 tablet  by mouth daily as needed. For constipation   Yes Historical Provider, MD  spironolactone (ALDACTONE) 25 MG tablet Take 50 mg by mouth 2 (two) times daily.   Yes Historical Provider, MD   Physical Exam: Filed Vitals:   05/23/12 0112 05/23/12 0124 05/23/12 0231 05/23/12 0414  BP:    168/88  Pulse: 78 70  91  Temp:   98.3 F (36.8 C)   TempSrc:   Oral   Resp: 29 30    Weight:      SpO2: 100% 100%  100%    General:  NAD, resting comfortably in bed Eyes: PEERLA EOMI ENT: mucous membranes moist Neck: supple w/o JVD Cardiovascular: irr, irr, w/o MRG Respiratory: Very coarse breath sounds bilaterally Abdomen: soft, nt, nd, bs+ Skin: no rash nor lesion Musculoskeletal: MAE, full ROM all 4 extremities Psychiatric: normal tone and affect Neurologic: AAOx3, grossly non-focal  Labs on Admission:  Basic Metabolic Panel:  Lab 05/22/12 4782  NA 138  K 4.4  CL 103  CO2 21  GLUCOSE 284*  BUN 32*  CREATININE 2.12*  CALCIUM 9.7  MG --  PHOS --   Liver Function Tests:  Lab 05/22/12 2242  AST 33  ALT 23  ALKPHOS 90  BILITOT 0.3  PROT 7.7  ALBUMIN 3.4*   No results found for this basename: LIPASE:5,AMYLASE:5 in the last 168 hours No results found for this basename: AMMONIA:5 in the last 168 hours CBC:  Lab 05/23/12 0115  WBC 6.1  NEUTROABS --  HGB 12.6*  HCT 39.6  MCV 89.8  PLT 164   Cardiac Enzymes:  Lab 05/22/12 2242  CKTOTAL --  CKMB --  CKMBINDEX --  TROPONINI <0.30    BNP (last 3 results) No results found for this basename: PROBNP:3 in the last 8760 hours CBG:  Lab 05/22/12 2226  GLUCAP 256*    Radiological Exams on Admission: Dg Chest Port 1 View  05/22/2012  *RADIOLOGY REPORT*  Clinical Data: Fever and cough.  PORTABLE CHEST - 1 VIEW  Comparison: 05/30/2009  Findings: Stable appearance of postoperative changes in the mediastinum.  Shallow inspiration.  Cardiac enlargement similar previous study.  There is development of mild pulmonary vascular  congestion without edema.  No blunting of costophrenic angles.  No pneumothorax.  No focal consolidation.  Mediastinal contours appear intact.  Calcification of the aorta.  IMPRESSION: Shallow inspiration.  Cardiac enlargement with mild pulmonary vascular congestion.   Original Report Authenticated By: Burman Nieves, M.D.     EKG: Independently reviewed.  Assessment/Plan Principal Problem:  *CAP (community acquired pneumonia) Active Problems:  CHF (congestive heart failure)   1. CAP - will treat patient with cap treatment + tamiflu, wife also was recently ill.  Cultures and flu PCR pending.  Tachycardia improved at this point. 2. CHF - feel that the patient is fluid overloaded at this point, with pulmonary vascular congestion identified on CXR, and a known history of CHF in the past.  Will hold his home 40mg  daily of lasix and instead gently diurese him with 40mg  IV lasix Q12H, and see how he responds to this. 3. Sepsis - due to suspected CAP given respiratory and systemic symptoms even if such not identified on CXR at this point. 4. Renal insufficiency - unclear baseline, but it is worth noting that his previous labs from 2010 did demonstrate an elevated creatinine and his family confirms that they know he has some degree of CKD at baseline.  Re check BMP in AM.  Again however given the CXR findings of pulmonary vascular congestion do not feel that he is intravascularly dry by any means.  No consultations obtained  Code Status: Full Code (must indicate code status--if unknown or must be presumed, indicate so) Family Communication: Spoke with wife and daughter in room (indicate person spoken with, if applicable, with phone number if by telephone) Disposition Plan: Admit to inpatient (indicate anticipated LOS)  Time spent: 70 min  Blythe Veach M. [facility] Pager [phone]  If 7PM-7AM, please contact night-coverage [email or web address] Password [facility] 05/23/2012, 4:42  AM

## 2012-05-23 NOTE — Treatment Plan (Signed)
Chart reviewed. Pt examined. Continue current tx. Added PT/OT/nutrition eval and glucerna

## 2012-05-23 NOTE — ED Notes (Signed)
Dr. Gardner at bedside 

## 2012-05-24 DIAGNOSIS — I10 Essential (primary) hypertension: Secondary | ICD-10-CM

## 2012-05-24 LAB — BASIC METABOLIC PANEL
CO2: 23 mEq/L (ref 19–32)
Calcium: 8.8 mg/dL (ref 8.4–10.5)
Creatinine, Ser: 2.07 mg/dL — ABNORMAL HIGH (ref 0.50–1.35)
Glucose, Bld: 192 mg/dL — ABNORMAL HIGH (ref 70–99)
Sodium: 136 mEq/L (ref 135–145)

## 2012-05-24 LAB — URINE CULTURE: Culture: NO GROWTH

## 2012-05-24 LAB — GLUCOSE, CAPILLARY: Glucose-Capillary: 226 mg/dL — ABNORMAL HIGH (ref 70–99)

## 2012-05-24 MED ORDER — LORAZEPAM 2 MG/ML IJ SOLN: 1.0000 mg | Freq: Once | INTRAMUSCULAR | Status: DC

## 2012-05-24 MED ORDER — FUROSEMIDE 20 MG PO TABS
20.0000 mg | ORAL_TABLET | Freq: Every day | ORAL | Status: DC
  Filled 2012-05-24: qty 1

## 2012-05-24 MED ORDER — VITAMINS A & D EX OINT
TOPICAL_OINTMENT | CUTANEOUS | Status: AC
  Administered 2012-05-24: 12:00:00
  Filled 2012-05-24: qty 5

## 2012-05-24 MED ORDER — OSELTAMIVIR PHOSPHATE 75 MG PO CAPS: 75.0000 mg | ORAL_CAPSULE | Freq: Two times a day (BID) | ORAL | Status: DC

## 2012-05-24 NOTE — Progress Notes (Signed)
[facility] PROGRESS NOTE  Collin Fox RUE:[id number] DOB: 1945/06/29 DOA: 05/22/2012 PCP: Nada Boozer, MD  Assessment/Plan: *CAP (community acquired pneumonia) due Flu A Active Problems:  CHF (congestive heart failure)   1. CAP due to influenza A, Flu test positive, H1N1 PCR positive. Will taper abx and continue tamiflu. tachycardia improved. Can go home on tamiflu in am.  2. CHF - feel that the patient is fluid overloaded at this point, with pulmonary vascular congestion identified on CXR, and a known history of CHF in the past. Will reduce lasix to 40mg  daily instead of 40mg  IV lasix Q12H, He is improved 3. Sepsis - due to suspected CAP given respiratory and systemic symptoms even if such not identified on CXR at this point. 4. Renal insufficiency - unclear baseline, but it is worth noting that his previous labs from 2010 did demonstrate an elevated creatinine and his family confirms that they know he has some degree of CKD at baseline. Re check BMP in AM. Again however given the CXR findings of pulmonary vascular congestion do not feel that he is intravascularly dry by any means. 5.   Hypotension : reduced lasix dose today. Hold BP meds.    Code Status: Full Code   Family Communication: none Disposition Plan: D/C in am after PT/OT eval    HPI/Subjective: Patient has no new complaints. BP has been high. No fever, chills, n/v/d noted.   Objective: Filed Vitals:   05/24/12 0000 05/24/12 0506 05/24/12 1212 05/24/12 1501  BP:  117/68 111/66 93/61  Pulse:  73  92  Temp: 98.6 F (37 C) 98.6 F (37 C)  98.4 F (36.9 C)  TempSrc:  Oral  Oral  Resp:  18  20  Height:      Weight:      SpO2:  98%  100%    Intake/Output Summary (Last 24 hours) at 05/24/12 1535 Last data filed at 05/24/12 1502  Gross per 24 hour  Intake    660 ml  Output   1625 ml  Net   -965 ml   Filed Weights   05/22/12 2224 05/23/12 0503  Weight: 102.059 kg (225 lb) 93.5 kg (206 lb 2.1 oz)     Exam:   General:  A, O x3, NAD  Cardiovascular: S1S2 reg, no m/r/g  Respiratory: CTAB, no w/r/c  Abdomen: NT, ND, BS+  Data Reviewed: Basic Metabolic Panel:  Lab 05/22/12 1478  NA 138  K 4.4  CL 103  CO2 21  GLUCOSE 284*  BUN 32*  CREATININE 2.12*  CALCIUM 9.7  MG --  PHOS --   Liver Function Tests:  Lab 05/22/12 2242  AST 33  ALT 23  ALKPHOS 90  BILITOT 0.3  PROT 7.7  ALBUMIN 3.4*   No results found for this basename: LIPASE:5,AMYLASE:5 in the last 168 hours No results found for this basename: AMMONIA:5 in the last 168 hours CBC:  Lab 05/23/12 0115  WBC 6.1  NEUTROABS --  HGB 12.6*  HCT 39.6  MCV 89.8  PLT 164   Cardiac Enzymes:  Lab 05/22/12 2242  CKTOTAL --  CKMB --  CKMBINDEX --  TROPONINI <0.30   BNP (last 3 results) No results found for this basename: PROBNP:3 in the last 8760 hours CBG:  Lab 05/24/12 1228 05/24/12 0746 05/23/12 2133 05/23/12 1653 05/23/12 1154  GLUCAP 173* 150* 124* 262* 183*    Recent Results (from the past 240 hour(s))  CULTURE, BLOOD (ROUTINE X 2)  Status: Normal (Preliminary result)   Collection Time   05/22/12 11:20 PM      Component Value Range Status Comment   Specimen Description BLOOD LEFT HAND   Final    Special Requests BOTTLES DRAWN AEROBIC AND ANAEROBIC 5CC   Final    Culture  Setup Time 05/23/2012 04:37   Final    Culture     Final    Value:        BLOOD CULTURE RECEIVED NO GROWTH TO DATE CULTURE WILL BE HELD FOR 5 DAYS BEFORE ISSUING A FINAL NEGATIVE REPORT   Report Status PENDING   Incomplete   URINE CULTURE     Status: Normal   Collection Time   05/23/12 12:12 AM      Component Value Range Status Comment   Specimen Description URINE, CATHETERIZED   Final    Special Requests NONE   Final    Culture  Setup Time 05/23/2012 04:42   Final    Colony Count NO GROWTH   Final    Culture NO GROWTH   Final    Report Status 05/24/2012 FINAL   Final   CULTURE, BLOOD (ROUTINE X 2)     Status:  Normal (Preliminary result)   Collection Time   05/23/12  1:15 AM      Component Value Range Status Comment   Specimen Description BLOOD RIGHT HAND   Final    Special Requests BOTTLES DRAWN AEROBIC AND ANAEROBIC 2CC   Final    Culture  Setup Time 05/23/2012 04:37   Final    Culture     Final    Value:        BLOOD CULTURE RECEIVED NO GROWTH TO DATE CULTURE WILL BE HELD FOR 5 DAYS BEFORE ISSUING A FINAL NEGATIVE REPORT   Report Status PENDING   Incomplete      Studies: Dg Chest Port 1 View  05/22/2012  *RADIOLOGY REPORT*  Clinical Data: Fever and cough.  PORTABLE CHEST - 1 VIEW  Comparison: 05/30/2009  Findings: Stable appearance of postoperative changes in the mediastinum.  Shallow inspiration.  Cardiac enlargement similar previous study.  There is development of mild pulmonary vascular congestion without edema.  No blunting of costophrenic angles.  No pneumothorax.  No focal consolidation.  Mediastinal contours appear intact.  Calcification of the aorta.  IMPRESSION: Shallow inspiration.  Cardiac enlargement with mild pulmonary vascular congestion.   Original Report Authenticated By: Burman Nieves, M.D.     Scheduled Meds:   . amLODipine  10 mg Oral q morning - 10a  . antiseptic oral rinse  15 mL Mouth Rinse BID  . aspirin  81 mg Oral q morning - 10a  . atorvastatin  20 mg Oral q1800  . atropine  1 drop Right Eye BID  . azithromycin  500 mg Intravenous QHS  . brimonidine  1 drop Both Eyes TID  . calcitRIOL  0.25 mcg Oral q morning - 10a  . carbamazepine  300 mg Oral BID  . cefTRIAXone (ROCEPHIN)  IV  1 g Intravenous QHS  . cloNIDine  0.2 mg Transdermal Q Wed  . dorzolamide-timolol  1 drop Both Eyes Daily  . feeding supplement  237 mL Oral TID BM  . furosemide  40 mg Intravenous Q12H  . heparin  5,000 Units Subcutaneous Q8H  . hydrALAZINE  25 mg Oral TID  . insulin aspart  0-9 Units Subcutaneous TID WC  . insulin aspart protamine-insulin aspart  10 Units Subcutaneous Q supper   .  insulin aspart protamine-insulin aspart  30 Units Subcutaneous Q breakfast  . latanoprost  1 drop Both Eyes QHS  . losartan  100 mg Oral q morning - 10a  . metoprolol  200 mg Oral q morning - 10a  . oseltamivir  75 mg Oral BID  . polyvinyl alcohol  1 drop Both Eyes QID  . sodium chloride  3 mL Intravenous Q12H  . sodium chloride  3 mL Intravenous Q12H  . spironolactone  50 mg Oral BID   Continuous Infusions:   Principal Problem:  *CAP (community acquired pneumonia) Active Problems:  CHF (congestive heart failure)  Renal insufficiency  Sepsis    Time spent: 35 min   Younique Casad V.  [facility] Pager [phone]. If 8PM-8AM, please contact night-coverage at [email or web address], password [facility] 05/24/2012, 3:35 PM  LOS: 2 days

## 2012-05-24 NOTE — Progress Notes (Signed)
INITIAL ADULT NUTRITION ASSESSMENT Date: 05/24/2012   Time: 8:41 AM  Reason for Assessment: Consult, poor PO intake  INTERVENTION: 1. Continue current diet with Glucerna shakes TID.  2. RD to follow nutrition plan of care  ASSESSMENT: Male 66 y.o.  Dx: CAP (community acquired pneumonia)  Hx:  Past Medical History  Diagnosis Date  . Stroke   . Diabetes mellitus without complication   . Hypertension   . Coronary artery disease   . Atrial fibrillation     Related Meds:     . amLODipine  10 mg Oral q morning - 10a  . antiseptic oral rinse  15 mL Mouth Rinse BID  . aspirin  81 mg Oral q morning - 10a  . atorvastatin  20 mg Oral q1800  . atropine  1 drop Right Eye BID  . azithromycin  500 mg Intravenous QHS  . brimonidine  1 drop Both Eyes TID  . calcitRIOL  0.25 mcg Oral q morning - 10a  . carbamazepine  300 mg Oral BID  . cefTRIAXone (ROCEPHIN)  IV  1 g Intravenous QHS  . cloNIDine  0.2 mg Transdermal Q Wed  . dorzolamide-timolol  1 drop Both Eyes Daily  . feeding supplement  237 mL Oral TID BM  . furosemide  40 mg Intravenous Q12H  . heparin  5,000 Units Subcutaneous Q8H  . hydrALAZINE  25 mg Oral TID  . insulin aspart  0-9 Units Subcutaneous TID WC  . insulin aspart protamine-insulin aspart  10 Units Subcutaneous Q supper  . insulin aspart protamine-insulin aspart  30 Units Subcutaneous Q breakfast  . latanoprost  1 drop Both Eyes QHS  . losartan  100 mg Oral q morning - 10a  . metoprolol  200 mg Oral q morning - 10a  . oseltamivir  75 mg Oral BID  . polyvinyl alcohol  1 drop Both Eyes QID  . sodium chloride  3 mL Intravenous Q12H  . sodium chloride  3 mL Intravenous Q12H  . spironolactone  50 mg Oral BID    Ht: 6\' 2"  (188 cm)  Wt: 206 lb 2.1 oz (93.5 kg)  Ideal Wt: 86.4 kg % Ideal Wt: 108%  Usual Wt: 94 kg % Usual Wt: 100%  Body mass index is 26.47 kg/(m^2). Patient is overweight.   Food/Nutrition Related Hx: Patient with history of poor PO intake.  His intake currently is good. He denies recent weight loss. He is eating most of his meals and drinking most of his Glucerna shakes.   Labs:  CMP     Component Value Date/Time   NA 138 05/22/2012 2242   K 4.4 05/22/2012 2242   CL 103 05/22/2012 2242   CO2 21 05/22/2012 2242   GLUCOSE 284* 05/22/2012 2242   BUN 32* 05/22/2012 2242   CREATININE 2.12* 05/22/2012 2242   CALCIUM 9.7 05/22/2012 2242   PROT 7.7 05/22/2012 2242   ALBUMIN 3.4* 05/22/2012 2242   AST 33 05/22/2012 2242   ALT 23 05/22/2012 2242   ALKPHOS 90 05/22/2012 2242   BILITOT 0.3 05/22/2012 2242   GFRNONAA 31* 05/22/2012 2242   GFRAA 35* 05/22/2012 2242    Intake/Output Summary (Last 24 hours) at 05/24/12 0842 Last data filed at 05/24/12 0810  Gross per 24 hour  Intake   1260 ml  Output   2120 ml  Net   -860 ml    Diet Order: Carb Control, 100% intake  Supplements/Tube Feeding: Glucerna shake, TID  IVF:    Estimated  Nutritional Needs:   Kcal: 2150-2300 kcal Protein: 95-110 g Fluid: 2.6 L  NUTRITION DIAGNOSIS: -Predicted suboptimal energy intake (NI-1.6).  Status: Ongoing  RELATED TO: pneumonia  AS EVIDENCE BY: recent history of decreased intake  MONITORING/EVALUATION(Goals): Patient will meet 90-100% of estimated nutrition needs  Monitor: PO intake, weight, labs  EDUCATION NEEDS: -No education needs identified at this time   [name], RD, LDN Pager #: [phone] After-Hours Pager #: [phone]   DOCUMENTATION CODES Per approved criteria  -Not Applicable    [name] [facility] 05/24/2012, 8:41 AM

## 2012-05-24 NOTE — Care Management Note (Signed)
    Page 1 of 1   05/24/2012     4:49:03 PM   CARE MANAGEMENT NOTE 05/24/2012  Patient:  Collin Fox, Collin Fox   Account Number:  [id number]  Date Initiated:  05/24/2012  Documentation initiated by:  Lanier Clam  Subjective/Objective Assessment:   admitted w/sob.pna.     Action/Plan:   From home alone.Has family support.Has pcp-Wendy Orson Aloe.Has pharmacy.   Anticipated DC Date:  05/24/2012   Anticipated DC Plan:  HOME/SELF CARE      DC Planning Services  CM consult      Choice offered to / List presented to:             Status of service:  Completed, signed off Medicare Important Message given?   (If response is "NO", the following Medicare IM given date fields will be blank) Date Medicare IM given:   Date Additional Medicare IM given:    Discharge Disposition:  HOME/SELF CARE  Per UR Regulation:    If discussed at Long Length of Stay Meetings, dates discussed:    Comments:  05/24/12 Lanier Clam RN,BSN NCM [phone] MD cancelled HH.Patient to return home, & family will stay their for a few days then he will be by himself.

## 2012-05-24 NOTE — Discharge Summary (Signed)
Physician Discharge Summary  Collin Fox ZOX:[id number] DOB: January 08, 1946 DOA: 05/22/2012  PCP: Nada Boozer, MD  Admit date: 05/22/2012 Discharge date: 05/24/2012  Recommendations for Outpatient Follow-up:  1. F/u with PCP in 1 to 2 weeks of discharge.   Discharge Diagnoses:  Principal Problem:  *CAP (community acquired pneumonia) Active Problems:  CHF (congestive heart failure)  Renal insufficiency  Sepsis  Hypertension   Discharge Condition: stable  Diet recommendation: cardiac and low salt  Filed Weights   05/22/12 2224 05/23/12 0503  Weight: 102.059 kg (225 lb) 93.5 kg (206 lb 2.1 oz)    History of present illness:   Collin Fox is a 66 y.o. male who presents with c/o cough, fever, chills, fatigue, SOB. Symptoms onset 2 days ago, cough is productive and frequent. He does have some difficulty with speaking due to a prior stroke, family states mental status is at baseline. Nothing improves nor worsens his symptoms.  In the ED he was found to be febrile, tachycardic, tachypnic, CXR demonstrated pulmonary vascular congestion but no obvious infiltrate. Hospitalist has been asked to admit.  Review of Systems: positive for occasional leg swelling though not that bad today, full body aches, recent sick contact (wife with URI and productive cough), 12 systems reviewed and otherwise negative.   Hospital Course:  *CAP (community acquired pneumonia) due Flu A  Active Problems:  CHF (congestive heart failure)  1. CAP due to influenza A, Flu test positive, H1N1 PCR positive. Patient was started on IV abx and Tamiflu. Tamiflu was continued on discharge. He plans to go home with his wife for few days and then he will contiue to live by himself.  2. CHF - It was felt that the patient was fluid overloaded at this point, with pulmonary vascular congestion identified on CXR, and a known history of CHF in the past. He was treated aggresively with Lasix 40 mg IV BID and changed to  home dose on discharge.  3. Sepsis - due to suspected CAP given respiratory and systemic symptoms even if such not identified on CXR at this point. He was stable during the stay.  4. Renal insufficiency - unclear baseline, but it is worth noting that his previous labs from 2010 did demonstrate an elevated creatinine and his family confirms that they know he has some degree of CKD at baseline. He will get another BMP prior to discharge today.  Again however given the CXR findings of pulmonary vascular congestion do not feel that he is intravascularly dry by any means. He may need nephrology f/u in the future.   5. Hypotension : Due to overdiuresis. Patient and family were happy that first time his BP was under control and did not take his usual 5 BP meds every day. They want to continue to have this at home and slowly start BP meds based on his BP at home. He was stable at the time of discharge.   Procedures: none Consultations:  none  Discharge Exam: Filed Vitals:   05/24/12 0000 05/24/12 0506 05/24/12 1212 05/24/12 1501  BP:  117/68 111/66 93/61  Pulse:  73  92  Temp: 98.6 F (37 C) 98.6 F (37 C)  98.4 F (36.9 C)  TempSrc:  Oral  Oral  Resp:  18  20  Height:      Weight:      SpO2:  98%  100%    General: A, O x 3, NAD Cardiovascular: S1S2 reg, no m/r/g Respiratory: CTAB, no w/r/c  Discharge Instructions     Medication List     As of 05/24/2012  4:25 PM    TAKE these medications         amLODipine 10 MG tablet   Commonly known as: NORVASC   Take 10 mg by mouth every morning.      aspirin 81 MG chewable tablet   Chew 81 mg by mouth every morning.      atropine 1 % ophthalmic solution   Place 1 drop into the right eye 2 (two) times daily.      brimonidine 0.2 % ophthalmic solution   Commonly known as: ALPHAGAN   Place 1 drop into both eyes 3 (three) times daily.      calcitRIOL 0.25 MCG capsule   Commonly known as: ROCALTROL   Take 0.25 mcg by mouth every  morning.      carbamazepine 300 MG 12 hr capsule   Commonly known as: CARBATROL   Take 300 mg by mouth 2 (two) times daily.      cloNIDine 0.2 mg/24hr patch   Commonly known as: CATAPRES - Dosed in mg/24 hr   Place 1 patch onto the skin once a week. For high blood pressure. Change patch every Wednesday      docusate sodium 100 MG capsule   Commonly known as: COLACE   Take 100 mg by mouth 2 (two) times daily as needed. For constipation      dorzolamide-timolol 22.3-6.8 MG/ML ophthalmic solution   Commonly known as: COSOPT   Place 1 drop into both eyes daily.      furosemide 40 MG tablet   Commonly known as: LASIX   Take 40 mg by mouth every morning.      GARLIC PO   Take 1 tablet by mouth every morning.      hydrALAZINE 25 MG tablet   Commonly known as: APRESOLINE   Take 25 mg by mouth 3 (three) times daily.      hydrocerin Crea   Apply 1 application topically 2 (two) times daily as needed. For dry skin      insulin NPH-insulin regular (70-30) 100 UNIT/ML injection   Commonly known as: NOVOLIN 70/30   Inject 10-30 Units into the skin 2 (two) times daily with a meal. Inject 30 units with breakfast and 10 units with supper      latanoprost 0.005 % ophthalmic solution   Commonly known as: XALATAN   Place 1 drop into both eyes at bedtime.      losartan 100 MG tablet   Commonly known as: COZAAR   Take 100 mg by mouth every morning.      metoprolol 200 MG 24 hr tablet   Commonly known as: TOPROL-XL   Take 200 mg by mouth every morning.      oseltamivir 75 MG capsule   Commonly known as: TAMIFLU   Take 1 capsule (75 mg total) by mouth 2 (two) times daily.      polyvinyl alcohol 1.4 % ophthalmic solution   Commonly known as: LIQUIFILM TEARS   Place 1 drop into both eyes 4 (four) times daily.      rosuvastatin 20 MG tablet   Commonly known as: CRESTOR   Take 10 mg by mouth at bedtime.      senna 8.6 MG Tabs   Commonly known as: SENOKOT   Take 1 tablet by mouth  daily as needed. For constipation      spironolactone 25 MG tablet   Commonly  known as: ALDACTONE   Take 50 mg by mouth 2 (two) times daily.           Follow-up Information    Follow up with Nada Boozer, MD.   Contact information:   [address] [place] [address] [phone]           The results of significant diagnostics from this hospitalization (including imaging, microbiology, ancillary and laboratory) are listed below for reference.    Significant Diagnostic Studies: Dg Chest Port 1 View  05/22/2012  *RADIOLOGY REPORT*  Clinical Data: Fever and cough.  PORTABLE CHEST - 1 VIEW  Comparison: 05/30/2009  Findings: Stable appearance of postoperative changes in the mediastinum.  Shallow inspiration.  Cardiac enlargement similar previous study.  There is development of mild pulmonary vascular congestion without edema.  No blunting of costophrenic angles.  No pneumothorax.  No focal consolidation.  Mediastinal contours appear intact.  Calcification of the aorta.  IMPRESSION: Shallow inspiration.  Cardiac enlargement with mild pulmonary vascular congestion.   Original Report Authenticated By: Burman Nieves, M.D.     Microbiology: Recent Results (from the past 240 hour(s))  CULTURE, BLOOD (ROUTINE X 2)     Status: Normal (Preliminary result)   Collection Time   05/22/12 11:20 PM      Component Value Range Status Comment   Specimen Description BLOOD LEFT HAND   Final    Special Requests BOTTLES DRAWN AEROBIC AND ANAEROBIC 5CC   Final    Culture  Setup Time 05/23/2012 04:37   Final    Culture     Final    Value:        BLOOD CULTURE RECEIVED NO GROWTH TO DATE CULTURE WILL BE HELD FOR 5 DAYS BEFORE ISSUING A FINAL NEGATIVE REPORT   Report Status PENDING   Incomplete   URINE CULTURE     Status: Normal   Collection Time   05/23/12 12:12 AM      Component Value Range Status Comment   Specimen Description URINE, CATHETERIZED   Final    Special Requests NONE   Final     Culture  Setup Time 05/23/2012 04:42   Final    Colony Count NO GROWTH   Final    Culture NO GROWTH   Final    Report Status 05/24/2012 FINAL   Final   CULTURE, BLOOD (ROUTINE X 2)     Status: Normal (Preliminary result)   Collection Time   05/23/12  1:15 AM      Component Value Range Status Comment   Specimen Description BLOOD RIGHT HAND   Final    Special Requests BOTTLES DRAWN AEROBIC AND ANAEROBIC 2CC   Final    Culture  Setup Time 05/23/2012 04:37   Final    Culture     Final    Value:        BLOOD CULTURE RECEIVED NO GROWTH TO DATE CULTURE WILL BE HELD FOR 5 DAYS BEFORE ISSUING A FINAL NEGATIVE REPORT   Report Status PENDING   Incomplete      Labs: Basic Metabolic Panel:  Lab 05/22/12 9147  NA 138  K 4.4  CL 103  CO2 21  GLUCOSE 284*  BUN 32*  CREATININE 2.12*  CALCIUM 9.7  MG --  PHOS --   Liver Function Tests:  Lab 05/22/12 2242  AST 33  ALT 23  ALKPHOS 90  BILITOT 0.3  PROT 7.7  ALBUMIN 3.4*   No results found for this basename: LIPASE:5,AMYLASE:5 in  the last 168 hours No results found for this basename: AMMONIA:5 in the last 168 hours CBC:  Lab 05/23/12 0115  WBC 6.1  NEUTROABS --  HGB 12.6*  HCT 39.6  MCV 89.8  PLT 164   Cardiac Enzymes:  Lab 05/22/12 2242  CKTOTAL --  CKMB --  CKMBINDEX --  TROPONINI <0.30   BNP: BNP (last 3 results) No results found for this basename: PROBNP:3 in the last 8760 hours CBG:  Lab 05/24/12 1228 05/24/12 0746 05/23/12 2133 05/23/12 1653 05/23/12 1154  GLUCAP 173* 150* 124* 262* 183*    Time coordinating discharge: 45 minutes  Signed:  Yoseph Haile V.  [facility] 05/24/2012, 4:25 PM

## 2012-05-24 NOTE — Progress Notes (Signed)
Utilization Review Completed.Collin Fox T12/15/2013   

## 2012-05-29 LAB — CULTURE, BLOOD (ROUTINE X 2): Culture: NO GROWTH

## 2014-11-13 ENCOUNTER — Ambulatory Visit (INDEPENDENT_AMBULATORY_CARE_PROVIDER_SITE_OTHER): Payer: Non-veteran care | Admitting: Emergency Medicine

## 2014-11-13 VITALS — BP 189/69 | HR 61 | Temp 98.3°F | Resp 20 | Ht 70.5 in | Wt 203.4 lb

## 2014-11-13 DIAGNOSIS — S80812A Abrasion, left lower leg, initial encounter: Secondary | ICD-10-CM

## 2014-11-13 DIAGNOSIS — I639 Cerebral infarction, unspecified: Secondary | ICD-10-CM | POA: Diagnosis not present

## 2014-11-13 DIAGNOSIS — Z23 Encounter for immunization: Secondary | ICD-10-CM

## 2014-11-13 DIAGNOSIS — H6123 Impacted cerumen, bilateral: Secondary | ICD-10-CM

## 2014-11-13 NOTE — Patient Instructions (Signed)

## 2014-11-13 NOTE — Progress Notes (Signed)
Subjective:  Patient ID: Collin Fox, male    DOB: Feb 22, 1946  Age: 69 y.o. MRN: [id number]  CC: Fall and Ear Pain   HPI Collin Fox presents  with injuries sustained in a fall from a church bus today. He has an abrasion on his left lower leg. There is some skin tearing associated with it. He is not current on tetanus.  He has decreased auditory acuity and pressure in both ears. They're concerned that he might have wax accumulation in both ears. He has no nasal congestion postnasal drainage and a cough fever chills or other complaints. Her graphics had no improvement with over-the-counter medication.  History Collin Fox has a past medical history of Stroke; Diabetes mellitus without complication; Hypertension; Coronary artery disease; Atrial fibrillation; and Heart attack.   He has past surgical history that includes Coronary artery bypass graft; Eye surgery; and Prostate surgery.   His  family history includes Diabetes in his brother; Heart disease in his brother; Hypertension in his brother.  He   reports that he has never smoked. He has never used smokeless tobacco. He reports that he does not drink alcohol or use illicit drugs.  Outpatient Prescriptions Prior to Visit  Medication Sig Dispense Refill  . amLODipine (NORVASC) 10 MG tablet Take 10 mg by mouth every morning.    Marland Kitchen aspirin 81 MG chewable tablet Chew 81 mg by mouth every morning.    Marland Kitchen atropine 1 % ophthalmic solution Place 1 drop into the right eye 2 (two) times daily.     . brimonidine (ALPHAGAN) 0.2 % ophthalmic solution Place 1 drop into both eyes 3 (three) times daily.    . calcitRIOL (ROCALTROL) 0.25 MCG capsule Take 0.25 mcg by mouth every morning.    . carbamazepine (CARBATROL) 300 MG 12 hr capsule Take 300 mg by mouth 2 (two) times daily.    . cloNIDine (CATAPRES - DOSED IN MG/24 HR) 0.2 mg/24hr patch Place 1 patch onto the skin once a week. For high blood pressure. Change patch every Wednesday    . docusate  sodium (COLACE) 100 MG capsule Take 100 mg by mouth 2 (two) times daily as needed. For constipation    . dorzolamide-timolol (COSOPT) 22.3-6.8 MG/ML ophthalmic solution Place 1 drop into both eyes daily.    . furosemide (LASIX) 40 MG tablet Take 40 mg by mouth every morning.    Marland Kitchen GARLIC PO Take 1 tablet by mouth every morning.    . hydrALAZINE (APRESOLINE) 25 MG tablet Take 25 mg by mouth 3 (three) times daily.    . hydrocerin (EUCERIN) CREA Apply 1 application topically 2 (two) times daily as needed. For dry skin    . insulin NPH-insulin regular (NOVOLIN 70/30) (70-30) 100 UNIT/ML injection Inject 10-30 Units into the skin 2 (two) times daily with a meal. Inject 30 units with breakfast and 10 units with supper    . latanoprost (XALATAN) 0.005 % ophthalmic solution Place 1 drop into both eyes at bedtime.    Marland Kitchen losartan (COZAAR) 100 MG tablet Take 100 mg by mouth every morning.    . metoprolol (TOPROL-XL) 200 MG 24 hr tablet Take 200 mg by mouth every morning.    . polyvinyl alcohol (LIQUIFILM TEARS) 1.4 % ophthalmic solution Place 1 drop into both eyes 4 (four) times daily.    . rosuvastatin (CRESTOR) 20 MG tablet Take 10 mg by mouth at bedtime.    . senna (SENOKOT) 8.6 MG TABS Take 1 tablet by mouth daily  as needed. For constipation    . spironolactone (ALDACTONE) 25 MG tablet Take 50 mg by mouth 2 (two) times daily.    Marland Kitchen oseltamivir (TAMIFLU) 75 MG capsule Take 1 capsule (75 mg total) by mouth 2 (two) times daily. 8 capsule 0   No facility-administered medications prior to visit.    History   Social History  . Marital Status: Single    Spouse Name: N/A  . Number of Children: N/A  . Years of Education: N/A   Social History Main Topics  . Smoking status: Never Smoker   . Smokeless tobacco: Never Used  . Alcohol Use: No  . Drug Use: No  . Sexual Activity: Not on file   Other Topics Concern  . None   Social History Narrative     Review of Systems  Constitutional: Negative for  fever, chills and appetite change.  HENT: Positive for ear pain and hearing loss. Negative for congestion, postnasal drip, sinus pressure and sore throat.   Eyes: Negative for pain and redness.  Respiratory: Negative for cough, shortness of breath and wheezing.   Cardiovascular: Negative for leg swelling.  Gastrointestinal: Negative for nausea, vomiting, abdominal pain, diarrhea, constipation and blood in stool.  Endocrine: Negative for polyuria.  Genitourinary: Negative for dysuria, urgency, frequency and flank pain.  Musculoskeletal: Negative for gait problem.  Skin: Positive for wound. Negative for rash.  Neurological: Negative for weakness and headaches.  Psychiatric/Behavioral: Negative for confusion and decreased concentration. The patient is not nervous/anxious.     Objective:  BP 189/69 mmHg  Pulse 61  Temp(Src) 98.3 F (36.8 C) (Oral)  Resp 20  Ht 5' 10.5" (1.791 m)  Wt 203 lb 6 oz (92.25 kg)  BMI 28.76 kg/m2  SpO2 97%  Physical Exam  Constitutional: He is oriented to person, place, and time. He appears well-developed and well-nourished. No distress.  HENT:  Head: Normocephalic and atraumatic.  Right Ear: External ear normal. A foreign body is present. Decreased hearing is noted.  Left Ear: External ear normal. A foreign body is present. Decreased hearing is noted.  Nose: Nose normal.  Eyes: Conjunctivae and EOM are normal. Pupils are equal, round, and reactive to light. No scleral icterus.  Neck: Normal range of motion. Neck supple. No tracheal deviation present.  Cardiovascular: Normal rate, regular rhythm and normal heart sounds.   Pulmonary/Chest: Effort normal. No respiratory distress. He has no wheezes. He has no rales.  Abdominal: He exhibits no mass. There is no tenderness. There is no rebound and no guarding.  Musculoskeletal: He exhibits no edema.  Lymphadenopathy:    He has no cervical adenopathy.  Neurological: He is alert and oriented to person, place,  and time. Coordination normal.  Skin: Skin is warm and dry. Abrasion and laceration noted. No rash noted.  Psychiatric: He has a normal mood and affect. His behavior is normal.      Assessment & Plan:   Collin Fox was seen today for fall and ear pain.  Diagnoses and all orders for this visit:  Cerumen impaction, bilateral  Stroke  Abrasion of left leg, initial encounter  Other orders -     Tdap vaccine greater than or equal to 7yo IM   I have discontinued Collin Fox oseltamivir. I am also having him maintain his polyvinyl alcohol, atropine, calcitRIOL, dorzolamide-timolol, hydrocerin, metoprolol, amLODipine, aspirin, brimonidine, carbamazepine, docusate sodium, furosemide, hydrALAZINE, insulin NPH-regular Human, latanoprost, losartan, rosuvastatin, senna, spironolactone, GARLIC PO, and cloNIDine.  No orders of the defined types were  placed in this encounter.    Patient had both of his ear is irrigated until clear this was done atraumatically and his auditory acuity hasn't improved. His wound was dressed. He was given a tetanus booster  Appropriate red flag conditions were discussed with the patient as well as actions that should be taken.  Patient expressed his understanding.  Follow-up: No Follow-up on file.  Roselee Culver, MD

## 2015-03-14 ENCOUNTER — Encounter (HOSPITAL_COMMUNITY): Payer: Self-pay | Admitting: Emergency Medicine

## 2015-03-14 ENCOUNTER — Emergency Department (HOSPITAL_COMMUNITY)
Admission: EM | Admit: 2015-03-14 | Discharge: 2015-03-14 | Disposition: A | Discharge status: Home / Self Care | Payer: Non-veteran care | Attending: Emergency Medicine | Admitting: Emergency Medicine

## 2015-03-14 DIAGNOSIS — I2510 Atherosclerotic heart disease of native coronary artery without angina pectoris: Secondary | ICD-10-CM | POA: Insufficient documentation

## 2015-03-14 DIAGNOSIS — E119 Type 2 diabetes mellitus without complications: Secondary | ICD-10-CM | POA: Diagnosis not present

## 2015-03-14 DIAGNOSIS — R531 Weakness: Secondary | ICD-10-CM | POA: Insufficient documentation

## 2015-03-14 DIAGNOSIS — Z8673 Personal history of transient ischemic attack (TIA), and cerebral infarction without residual deficits: Secondary | ICD-10-CM | POA: Insufficient documentation

## 2015-03-14 DIAGNOSIS — R42 Dizziness and giddiness: Secondary | ICD-10-CM | POA: Diagnosis not present

## 2015-03-14 DIAGNOSIS — Z7982 Long term (current) use of aspirin: Secondary | ICD-10-CM | POA: Insufficient documentation

## 2015-03-14 DIAGNOSIS — Z794 Long term (current) use of insulin: Secondary | ICD-10-CM | POA: Diagnosis not present

## 2015-03-14 DIAGNOSIS — I4891 Unspecified atrial fibrillation: Secondary | ICD-10-CM | POA: Diagnosis not present

## 2015-03-14 DIAGNOSIS — Z79899 Other long term (current) drug therapy: Secondary | ICD-10-CM | POA: Diagnosis not present

## 2015-03-14 DIAGNOSIS — I10 Essential (primary) hypertension: Secondary | ICD-10-CM | POA: Diagnosis not present

## 2015-03-14 DIAGNOSIS — Z951 Presence of aortocoronary bypass graft: Secondary | ICD-10-CM | POA: Insufficient documentation

## 2015-03-14 LAB — CBC WITH DIFFERENTIAL/PLATELET
Basophils Absolute: 0 10*3/uL (ref 0.0–0.1)
Basophils Relative: 1 %
Eosinophils Absolute: 0 10*3/uL (ref 0.0–0.7)
Eosinophils Relative: 1 %
HEMATOCRIT: 37.9 % — AB (ref 39.0–52.0)
HEMOGLOBIN: 12 g/dL — AB (ref 13.0–17.0)
LYMPHS ABS: 0.7 10*3/uL (ref 0.7–4.0)
LYMPHS PCT: 16 %
MCH: 28.5 pg (ref 26.0–34.0)
MCHC: 31.7 g/dL (ref 30.0–36.0)
MCV: 90 fL (ref 78.0–100.0)
MONOS PCT: 9 %
Monocytes Absolute: 0.4 10*3/uL (ref 0.1–1.0)
NEUTROS ABS: 3.3 10*3/uL (ref 1.7–7.7)
NEUTROS PCT: 73 %
Platelets: 182 10*3/uL (ref 150–400)
RBC: 4.21 MIL/uL — ABNORMAL LOW (ref 4.22–5.81)
RDW: 13.4 % (ref 11.5–15.5)
WBC: 4.4 10*3/uL (ref 4.0–10.5)

## 2015-03-14 LAB — URINALYSIS, ROUTINE W REFLEX MICROSCOPIC
Bilirubin Urine: NEGATIVE
HGB URINE DIPSTICK: NEGATIVE
Ketones, ur: NEGATIVE mg/dL
LEUKOCYTES UA: NEGATIVE
Nitrite: NEGATIVE
Protein, ur: NEGATIVE mg/dL
SPECIFIC GRAVITY, URINE: 1.017 (ref 1.005–1.030)
Urobilinogen, UA: 0.2 mg/dL (ref 0.0–1.0)
pH: 6 (ref 5.0–8.0)

## 2015-03-14 LAB — BASIC METABOLIC PANEL
Anion gap: 12 (ref 5–15)
BUN: 59 mg/dL — AB (ref 6–20)
CHLORIDE: 106 mmol/L (ref 101–111)
CO2: 22 mmol/L (ref 22–32)
CREATININE: 2.28 mg/dL — AB (ref 0.61–1.24)
Calcium: 9.7 mg/dL (ref 8.9–10.3)
GFR calc Af Amer: 32 mL/min — ABNORMAL LOW (ref >60)
GFR calc non Af Amer: 28 mL/min — ABNORMAL LOW (ref >60)
Glucose, Bld: 301 mg/dL — ABNORMAL HIGH (ref 65–99)
POTASSIUM: 4.7 mmol/L (ref 3.5–5.1)
Sodium: 140 mmol/L (ref 135–145)

## 2015-03-14 LAB — TROPONIN I: TROPONIN I: 0.03 ng/mL (ref <0.031)

## 2015-03-14 LAB — URINE MICROSCOPIC-ADD ON

## 2015-03-14 NOTE — ED Provider Notes (Signed)
CSN: [id number]     Arrival date & time 03/14/15  46 History   First MD Initiated Contact with Patient 03/14/15 1548     Chief Complaint  Patient presents with  . Weakness     (Consider location/radiation/quality/duration/timing/severity/associated sxs/prior Treatment) HPI Comments: Patient with PMH of stroke with baseline slurred speech, CAD, afib, HTN, DM presents to the ED with a chief complaint of dizziness and weakness.  Patient is here with his family, who state that this is somewhat typical for the patient.  They state that he takes lasix every other day for a week, and then every day for a week on any alternating schedule.  Family members states that when he is the the every day schedule he becomes weak, and frequently becomes dizzy, similar to today.  The exception being that today the patient fell to the ground, whereas normally, they are able to catch him.  Patient denies any injuries.  He denies any pain.    The history is provided by the patient and the spouse. No language interpreter was used.    Past Medical History  Diagnosis Date  . Stroke ([place])   . Diabetes mellitus without complication ([place])   . Hypertension   . Coronary artery disease   . Atrial fibrillation ([place])   . Heart attack [facility])    Past Surgical History  Procedure Laterality Date  . Coronary artery bypass graft    . Eye surgery    . Prostate surgery     Family History  Problem Relation Age of Onset  . Diabetes Brother   . Heart disease Brother   . Hypertension Brother    Social History  Substance Use Topics  . Smoking status: Never Smoker   . Smokeless tobacco: Never Used  . Alcohol Use: No    Review of Systems  Constitutional: Negative for fever and chills.  Respiratory: Negative for shortness of breath.   Cardiovascular: Negative for chest pain.  Gastrointestinal: Negative for nausea, vomiting, diarrhea and constipation.  Genitourinary: Negative for dysuria.  Neurological: Positive for  dizziness and weakness.  All other systems reviewed and are negative.     Allergies  Review of patient's allergies indicates no known allergies.  Home Medications   Prior to Admission medications   Medication Sig Start Date End Date Taking? Authorizing Provider  amLODipine (NORVASC) 10 MG tablet Take 10 mg by mouth every morning.    Historical Provider, MD  aspirin 81 MG chewable tablet Chew 81 mg by mouth every morning.    Historical Provider, MD  atropine 1 % ophthalmic solution Place 1 drop into the right eye 2 (two) times daily.     Historical Provider, MD  brimonidine (ALPHAGAN) 0.2 % ophthalmic solution Place 1 drop into both eyes 3 (three) times daily.    Historical Provider, MD  calcitRIOL (ROCALTROL) 0.25 MCG capsule Take 0.25 mcg by mouth every morning.    Historical Provider, MD  carbamazepine (CARBATROL) 300 MG 12 hr capsule Take 300 mg by mouth 2 (two) times daily.    Historical Provider, MD  cloNIDine (CATAPRES - DOSED IN MG/24 HR) 0.2 mg/24hr patch Place 1 patch onto the skin once a week. For high blood pressure. Change patch every Wednesday    Historical Provider, MD  docusate sodium (COLACE) 100 MG capsule Take 100 mg by mouth 2 (two) times daily as needed. For constipation    Historical Provider, MD  dorzolamide-timolol (COSOPT) 22.3-6.8 MG/ML ophthalmic solution Place 1 drop into both eyes  daily.    Historical Provider, MD  furosemide (LASIX) 40 MG tablet Take 40 mg by mouth every morning.    Historical Provider, MD  GARLIC PO Take 1 tablet by mouth every morning.    Historical Provider, MD  hydrocerin (EUCERIN) CREA Apply 1 application topically 2 (two) times daily as needed. For dry skin    Historical Provider, MD  insulin NPH-insulin regular (NOVOLIN 70/30) (70-30) 100 UNIT/ML injection Inject 10-30 Units into the skin 2 (two) times daily with a meal. Inject 30 units with breakfast and 10 units with supper    Historical Provider, MD  latanoprost (XALATAN) 0.005 %  ophthalmic solution Place 1 drop into both eyes at bedtime.    Historical Provider, MD  losartan (COZAAR) 100 MG tablet Take 100 mg by mouth every morning.    Historical Provider, MD  metoprolol (TOPROL-XL) 200 MG 24 hr tablet Take 200 mg by mouth every morning.    Historical Provider, MD  polyvinyl alcohol (LIQUIFILM TEARS) 1.4 % ophthalmic solution Place 1 drop into both eyes 4 (four) times daily.    Historical Provider, MD  rosuvastatin (CRESTOR) 20 MG tablet Take 10 mg by mouth at bedtime.    Historical Provider, MD  senna (SENOKOT) 8.6 MG TABS Take 1 tablet by mouth daily as needed. For constipation    Historical Provider, MD  spironolactone (ALDACTONE) 25 MG tablet Take 50 mg by mouth 2 (two) times daily.    Historical Provider, MD   BP 151/99 mmHg  Pulse 82  Temp(Src) 97.7 F (36.5 C) (Oral)  Resp 16  SpO2 99% Physical Exam  Constitutional: He is oriented to person, place, and time. He appears well-developed and well-nourished.  HENT:  Head: Normocephalic and atraumatic.  Eyes: Conjunctivae and EOM are normal. Pupils are equal, round, and reactive to light. Right eye exhibits no discharge. Left eye exhibits no discharge. No scleral icterus.  Neck: Normal range of motion. Neck supple. No JVD present.  Cardiovascular: Normal rate, regular rhythm and normal heart sounds.  Exam reveals no gallop and no friction rub.   No murmur heard. Pulmonary/Chest: Effort normal and breath sounds normal. No respiratory distress. He has no wheezes. He has no rales. He exhibits no tenderness.  CTAB  Abdominal: Soft. He exhibits no distension and no mass. There is no tenderness. There is no rebound and no guarding.  Musculoskeletal: Normal range of motion. He exhibits no edema or tenderness.  Neurological: He is alert and oriented to person, place, and time.  CN 3-12 intact, speech is slurred at baseline, normal ROM and strength of extremities  Skin: Skin is warm and dry.  Psychiatric: He has a normal  mood and affect. His behavior is normal. Judgment and thought content normal.  Nursing note and vitals reviewed.   ED Course  Procedures (including critical care time) Results for orders placed or performed during the hospital encounter of 03/14/15  CBC with Differential/Platelet  Result Value Ref Range   WBC 4.4 4.0 - 10.5 K/uL   RBC 4.21 (L) 4.22 - 5.81 MIL/uL   Hemoglobin 12.0 (L) 13.0 - 17.0 g/dL   HCT 37.9 (L) 39.0 - 52.0 %   MCV 90.0 78.0 - 100.0 fL   MCH 28.5 26.0 - 34.0 pg   MCHC 31.7 30.0 - 36.0 g/dL   RDW 13.4 11.5 - 15.5 %   Platelets 182 150 - 400 K/uL   Neutrophils Relative % 73 %   Neutro Abs 3.3 1.7 - 7.7 K/uL  Lymphocytes Relative 16 %   Lymphs Abs 0.7 0.7 - 4.0 K/uL   Monocytes Relative 9 %   Monocytes Absolute 0.4 0.1 - 1.0 K/uL   Eosinophils Relative 1 %   Eosinophils Absolute 0.0 0.0 - 0.7 K/uL   Basophils Relative 1 %   Basophils Absolute 0.0 0.0 - 0.1 K/uL  Basic metabolic panel  Result Value Ref Range   Sodium 140 135 - 145 mmol/L   Potassium 4.7 3.5 - 5.1 mmol/L   Chloride 106 101 - 111 mmol/L   CO2 22 22 - 32 mmol/L   Glucose, Bld 301 (H) 65 - 99 mg/dL   BUN 59 (H) 6 - 20 mg/dL   Creatinine, Ser 2.28 (H) 0.61 - 1.24 mg/dL   Calcium 9.7 8.9 - 10.3 mg/dL   GFR calc non Af Amer 28 (L) >60 mL/min   GFR calc Af Amer 32 (L) >60 mL/min   Anion gap 12 5 - 15  Urinalysis, Routine w reflex microscopic (not at [facility])  Result Value Ref Range   Color, Urine YELLOW YELLOW   APPearance CLEAR CLEAR   Specific Gravity, Urine 1.017 1.005 - 1.030   pH 6.0 5.0 - 8.0   Glucose, UA >1000 (A) NEGATIVE mg/dL   Hgb urine dipstick NEGATIVE NEGATIVE   Bilirubin Urine NEGATIVE NEGATIVE   Ketones, ur NEGATIVE NEGATIVE mg/dL   Protein, ur NEGATIVE NEGATIVE mg/dL   Urobilinogen, UA 0.2 0.0 - 1.0 mg/dL   Nitrite NEGATIVE NEGATIVE   Leukocytes, UA NEGATIVE NEGATIVE  Troponin I  Result Value Ref Range   Troponin I 0.03 <0.031 ng/mL  Urine microscopic-add on  Result  Value Ref Range   Squamous Epithelial / LPF RARE RARE   No results found.    MDM   Final diagnoses:  Weakness    Patient with weakness and dizziness.  Believe symptoms to be secondary to dehydration.  Per family, this is normal for the patient.  However, given patient's other comorbidities, will check basic labs, EKG, trop, and orthostatic VS.  Labs and workup are reassuring.  Patient seen by and discussed with Dr. Zenia Resides.  Plan for discharge to home with PCP follow-up.   Montine Circle, PA-C 03/14/15 1820

## 2015-03-14 NOTE — ED Notes (Signed)
Per [facility] pt from home , lives alone. Hx stroke. Per family pt has been weak , dizzy since this AM. Per family pt was unable to walk as usual. Pt's speech is slurred yet normal to baseline. Pt is alert and oriented x4. Pt also fell this Am , denies LOC, no head injury reported.

## 2015-03-14 NOTE — ED Notes (Signed)
Bed: GU54 Expected date:  Expected time:  Means of arrival:  Comments: EMS-weakness, ? dehdration

## 2015-03-14 NOTE — ED Provider Notes (Signed)
Medical screening examination/treatment/procedure(s) were conducted as a shared visit with non-physician practitioner(s) and myself.  I personally evaluated the patient during the encounter.   EKG Interpretation None     Patient here complaining of weakness that is usually associated after he increases his dose of Lasix. Labs here showed some worsening of his chronic renal insufficiency. He is afebrile. Family states that his eyes baseline. Will follow-up with his Dr.  Lacretia Leigh, MD 03/14/15 [phone]

## 2015-03-14 NOTE — Discharge Instructions (Signed)

## 2015-04-10 ENCOUNTER — Emergency Department (HOSPITAL_COMMUNITY): Payer: Non-veteran care

## 2015-04-10 ENCOUNTER — Emergency Department (HOSPITAL_COMMUNITY)
Admission: EM | Admit: 2015-04-10 | Discharge: 2015-04-10 | Disposition: A | Discharge status: Home / Self Care | Payer: Non-veteran care | Attending: Emergency Medicine | Admitting: Emergency Medicine

## 2015-04-10 ENCOUNTER — Encounter (HOSPITAL_COMMUNITY): Payer: Self-pay | Admitting: *Deleted

## 2015-04-10 DIAGNOSIS — I4891 Unspecified atrial fibrillation: Secondary | ICD-10-CM | POA: Diagnosis not present

## 2015-04-10 DIAGNOSIS — Z794 Long term (current) use of insulin: Secondary | ICD-10-CM | POA: Insufficient documentation

## 2015-04-10 DIAGNOSIS — I129 Hypertensive chronic kidney disease with stage 1 through stage 4 chronic kidney disease, or unspecified chronic kidney disease: Secondary | ICD-10-CM | POA: Insufficient documentation

## 2015-04-10 DIAGNOSIS — Z8701 Personal history of pneumonia (recurrent): Secondary | ICD-10-CM | POA: Insufficient documentation

## 2015-04-10 DIAGNOSIS — I2510 Atherosclerotic heart disease of native coronary artery without angina pectoris: Secondary | ICD-10-CM | POA: Diagnosis not present

## 2015-04-10 DIAGNOSIS — Z79899 Other long term (current) drug therapy: Secondary | ICD-10-CM | POA: Diagnosis not present

## 2015-04-10 DIAGNOSIS — N189 Chronic kidney disease, unspecified: Secondary | ICD-10-CM | POA: Diagnosis not present

## 2015-04-10 DIAGNOSIS — I252 Old myocardial infarction: Secondary | ICD-10-CM | POA: Insufficient documentation

## 2015-04-10 DIAGNOSIS — E1165 Type 2 diabetes mellitus with hyperglycemia: Secondary | ICD-10-CM | POA: Diagnosis not present

## 2015-04-10 DIAGNOSIS — R112 Nausea with vomiting, unspecified: Secondary | ICD-10-CM | POA: Diagnosis present

## 2015-04-10 DIAGNOSIS — R739 Hyperglycemia, unspecified: Secondary | ICD-10-CM

## 2015-04-10 DIAGNOSIS — Z8673 Personal history of transient ischemic attack (TIA), and cerebral infarction without residual deficits: Secondary | ICD-10-CM | POA: Insufficient documentation

## 2015-04-10 DIAGNOSIS — Z7982 Long term (current) use of aspirin: Secondary | ICD-10-CM | POA: Insufficient documentation

## 2015-04-10 DIAGNOSIS — Z951 Presence of aortocoronary bypass graft: Secondary | ICD-10-CM | POA: Diagnosis not present

## 2015-04-10 DIAGNOSIS — R110 Nausea: Secondary | ICD-10-CM

## 2015-04-10 LAB — COMPREHENSIVE METABOLIC PANEL
ALT: 23 U/L (ref 17–63)
AST: 25 U/L (ref 15–41)
Albumin: 3.9 g/dL (ref 3.5–5.0)
Alkaline Phosphatase: 106 U/L (ref 38–126)
Anion gap: 11 (ref 5–15)
BUN: 44 mg/dL — ABNORMAL HIGH (ref 6–20)
CO2: 23 mmol/L (ref 22–32)
Calcium: 9.8 mg/dL (ref 8.9–10.3)
Chloride: 105 mmol/L (ref 101–111)
Creatinine, Ser: 2.19 mg/dL — ABNORMAL HIGH (ref 0.61–1.24)
GFR calc Af Amer: 34 mL/min — ABNORMAL LOW (ref >60)
GFR calc non Af Amer: 29 mL/min — ABNORMAL LOW (ref >60)
Glucose, Bld: 283 mg/dL — ABNORMAL HIGH (ref 65–99)
Potassium: 3.9 mmol/L (ref 3.5–5.1)
Sodium: 139 mmol/L (ref 135–145)
Total Bilirubin: 0.4 mg/dL (ref 0.3–1.2)
Total Protein: 7.8 g/dL (ref 6.5–8.1)

## 2015-04-10 LAB — URINALYSIS, ROUTINE W REFLEX MICROSCOPIC
Bilirubin Urine: NEGATIVE
Glucose, UA: 500 mg/dL — AB
Hgb urine dipstick: NEGATIVE
Ketones, ur: NEGATIVE mg/dL
Leukocytes, UA: NEGATIVE
Nitrite: NEGATIVE
Protein, ur: 30 mg/dL — AB
Specific Gravity, Urine: 1.016 (ref 1.005–1.030)
Urobilinogen, UA: 0.2 mg/dL (ref 0.0–1.0)
pH: 6 (ref 5.0–8.0)

## 2015-04-10 LAB — CBC
HCT: 37 % — ABNORMAL LOW (ref 39.0–52.0)
Hemoglobin: 12 g/dL — ABNORMAL LOW (ref 13.0–17.0)
MCH: 29.2 pg (ref 26.0–34.0)
MCHC: 32.4 g/dL (ref 30.0–36.0)
MCV: 90 fL (ref 78.0–100.0)
Platelets: 183 10*3/uL (ref 150–400)
RBC: 4.11 MIL/uL — ABNORMAL LOW (ref 4.22–5.81)
RDW: 14 % (ref 11.5–15.5)
WBC: 4.8 10*3/uL (ref 4.0–10.5)

## 2015-04-10 LAB — URINE MICROSCOPIC-ADD ON

## 2015-04-10 LAB — I-STAT TROPONIN, ED: Troponin i, poc: 0.01 ng/mL (ref 0.00–0.08)

## 2015-04-10 LAB — CBG MONITORING, ED: Glucose-Capillary: 239 mg/dL — ABNORMAL HIGH (ref 65–99)

## 2015-04-10 LAB — LIPASE, BLOOD: Lipase: 19 U/L (ref 11–51)

## 2015-04-10 NOTE — ED Provider Notes (Signed)
CSN: [id number]     Arrival date & time 04/10/15  1457 History   First MD Initiated Contact with Patient 04/10/15 1528     Chief Complaint  Patient presents with  . "Sick", pt has difficulty communicating      (Consider location/radiation/quality/duration/timing/severity/associated sxs/prior Treatment) The history is provided by the patient and medical records.    69 year old male with history of stroke with residual speech impediment, diabetes, hypertension, coronary artery disease, A. fib, presenting to the ED for chief complaint "sick". Patient has difficulty communicating, history severely limited by this. Patient does admit he got a pneumonia shot and flu shot at the [place] today and began to feel bad when he was on the bus on the way home. He states he feels sick on his stomach and admits to vomiting. He denies abdominal pain. No chest pain or shortness of breath. No noted fever or chills. No sick contacts.  Patient also admits to feeling generally weak.  No new focal weakness.  Patient ambulates with cane at baseline.  Past Medical History  Diagnosis Date  . Stroke ([place])   . Diabetes mellitus without complication ([place])   . Hypertension   . Coronary artery disease   . Atrial fibrillation ([place])   . Heart attack [facility])    Past Surgical History  Procedure Laterality Date  . Coronary artery bypass graft    . Eye surgery    . Prostate surgery     Family History  Problem Relation Age of Onset  . Diabetes Brother   . Heart disease Brother   . Hypertension Brother    Social History  Substance Use Topics  . Smoking status: Never Smoker   . Smokeless tobacco: Never Used  . Alcohol Use: No    Review of Systems  Gastrointestinal: Positive for nausea and vomiting.  All other systems reviewed and are negative.     Allergies  Review of patient's allergies indicates no known allergies.  Home Medications   Prior to Admission medications   Medication Sig Start Date End Date  Taking? Authorizing Provider  amLODipine (NORVASC) 10 MG tablet Take 10 mg by mouth every morning.   Yes Historical Provider, MD  ammonium lactate (LAC-HYDRIN) 12 % lotion Apply 1 application topically daily.   Yes Historical Provider, MD  aspirin 81 MG chewable tablet Chew 81 mg by mouth every morning.   Yes Historical Provider, MD  atorvastatin (LIPITOR) 80 MG tablet Take 80 mg by mouth daily at 6 PM.   Yes Historical Provider, MD  brimonidine (ALPHAGAN) 0.2 % ophthalmic solution Place 1 drop into both eyes 3 (three) times daily.   Yes Historical Provider, MD  carbamazepine (CARBATROL) 300 MG 12 hr capsule Take 600 mg by mouth 2 (two) times daily.    Yes Historical Provider, MD  docusate sodium (COLACE) 100 MG capsule Take 100 mg by mouth 2 (two) times daily. For constipation   Yes Historical Provider, MD  dorzolamide-timolol (COSOPT) 22.3-6.8 MG/ML ophthalmic solution Place 1 drop into both eyes daily.   Yes Historical Provider, MD  furosemide (LASIX) 40 MG tablet Take 20-40 mg by mouth daily. Takes 20 or 40 mg daily. Alternates medication strength.   Yes Historical Provider, MD  hydrALAZINE (APRESOLINE) 100 MG tablet Take 100 mg by mouth 3 (three) times daily.   Yes Historical Provider, MD  insulin NPH-insulin regular (NOVOLIN 70/30) (70-30) 100 UNIT/ML injection Inject 15-30 Units into the skin 2 (two) times daily with a meal. Inject 30 units with  breakfast and 15 units with supper   Yes Historical Provider, MD  latanoprost (XALATAN) 0.005 % ophthalmic solution Place 1 drop into both eyes at bedtime.   Yes Historical Provider, MD  losartan (COZAAR) 100 MG tablet Take 100 mg by mouth every morning.   Yes Historical Provider, MD  metoprolol (TOPROL-XL) 200 MG 24 hr tablet Take 200 mg by mouth every morning.   Yes Historical Provider, MD  senna (SENOKOT) 8.6 MG TABS Take 1 tablet by mouth daily. For constipation   Yes Historical Provider, MD  spironolactone (ALDACTONE) 25 MG tablet Take 50 mg by  mouth 2 (two) times daily.   Yes Historical Provider, MD  cloNIDine (CATAPRES - DOSED IN MG/24 HR) 0.2 mg/24hr patch Place 1 patch onto the skin once a week. For high blood pressure. Change patch every Sunday.    Historical Provider, MD  leuprolide, 6 Month, (ELIGARD) 45 MG injection Inject 45 mg into the skin every 6 (six) months.    Historical Provider, MD   BP 122/71 mmHg  Pulse 103  Temp(Src) 97.6 F (36.4 C) (Oral)  Resp 16  SpO2 99%   Physical Exam  Constitutional: He appears well-developed and well-nourished. No distress.  HENT:  Head: Normocephalic and atraumatic.  Mouth/Throat: Oropharynx is clear and moist.  Eyes: Conjunctivae and EOM are normal. Pupils are equal, round, and reactive to light.  Neck: Normal range of motion. Neck supple.  Cardiovascular: Normal rate, regular rhythm and normal heart sounds.   Pulmonary/Chest: Effort normal and breath sounds normal. No respiratory distress. He has no wheezes.  Abdominal: Soft. Bowel sounds are normal. There is no tenderness. There is no guarding and no CVA tenderness.  Abdomen soft, non-distended, no focal tenderness, no peritonitis  Musculoskeletal: Normal range of motion. He exhibits no edema.  Neurological: He is alert.  Awake, alert; difficulty to answer questions given baseline speech abnormality; moving all extremities well; following commands when prompted; ambulating normally  Skin: Skin is warm and dry. He is not diaphoretic.  Psychiatric: He has a normal mood and affect.  Nursing note and vitals reviewed.   ED Course  Procedures (including critical care time) Labs Review Labs Reviewed  COMPREHENSIVE METABOLIC PANEL - Abnormal; Notable for the following:    Glucose, Bld 283 (*)    BUN 44 (*)    Creatinine, Ser 2.19 (*)    GFR calc non Af Amer 29 (*)    GFR calc Af Amer 34 (*)    All other components within normal limits  CBC - Abnormal; Notable for the following:    RBC 4.11 (*)    Hemoglobin 12.0 (*)     HCT 37.0 (*)    All other components within normal limits  CBG MONITORING, ED - Abnormal; Notable for the following:    Glucose-Capillary 239 (*)    All other components within normal limits  LIPASE, BLOOD  URINALYSIS, ROUTINE W REFLEX MICROSCOPIC (NOT AT [facility])  I-STAT TROPOININ, ED    Imaging Review Dg Abd Acute W/chest  04/10/2015  CLINICAL DATA:  Abdominal pain with nausea and vomiting. EXAM: DG ABDOMEN ACUTE W/ 1V CHEST COMPARISON:  Chest x-ray on 05/22/2012 FINDINGS: Lung volumes are low bilaterally with bibasilar atelectasis present. The heart size is at the upper limits of normal. The patient has had prior CABG. No edema, airspace consolidation or pleural fluid identified. Abdominal films show moderate fecal material throughout the colon. No evidence of bowel obstruction or fecal impaction. No free air identified. Vascular calcifications  are seen involving iliac arteries and femoral arteries bilaterally. Degenerative spondylosis noted of the lower lumbar spine. IMPRESSION: No acute findings.  Moderate fecal material throughout the colon. Electronically Signed   By: Aletta Edouard M.D.   On: 04/10/2015 16:23   I have personally reviewed and evaluated these images and lab results as part of my medical decision-making.   EKG Interpretation   Date/Time:  Monday April 10 2015 15:30:48 EDT Ventricular Rate:  96 PR Interval:    QRS Duration: 109 QT Interval:  406 QTC Calculation: 513 R Axis:   -13 Text Interpretation:  Atrial fibrillation Inferior MI (old or age  indeterminate) Prolonged QT interval similar to previous EKG from 05/2012  Confirmed by KOHUT  MD, STEPHEN [phone]) on 04/10/2015 3:35:06 PM      MDM   Final diagnoses:  CKD (chronic kidney disease), unspecified stage  Hyperglycemia  Nausea   69 year old male here with chief complaint of "sick". Patient has speech impediment at baseline from prior stroke, very difficult to communicate. No family members present for  additional history. Patient reports "not feeling well" after receiving fluids pneumonia vaccinations today. He had one shot in either arm, both sites covered with bandages but no signs of skin infection.  He reports nausea and vomiting, abdominal exam benign.  Lab work is reassuring, chronic kidney disease which appears baseline for patient when compared with prior values.  No leukocytosis. UA without signs of infection. EKG unchanged from prior. Troponin negative. Acute abdominal series with moderate stool burden noted, no signs of obstruction. No signs of pneumonia or other pulmonary infection.  Patient tolerating oral food and fluids here in the ED.  Abdominal exam remains benign.  Doubt acute/surgical abdomen and do not feel patient needs further work-up at this time.  Patient will be discharged home with PCP follow-up.  Discussed plan with patient, he/she acknowledged understanding and agreed with plan of care.  Return precautions given for new or worsening symptoms.  Case discussed with attending physician, Dr. Wilson Singer, agrees with assessment and plan of care.  Larene Pickett, PA-C 04/10/15 2046  Virgel Manifold, MD 04/11/15 [phone]

## 2015-04-10 NOTE — Discharge Instructions (Signed)
Work-up today was normal.  You may wish to take a stool softener like mira-lax, colace, dulcolax, etc. Please follow-up with your primary care physician. Return here for any new or worsening symptoms.

## 2015-04-10 NOTE — ED Notes (Signed)
Patient alert, respirations even and unlabored, skin warm and dry, speech is not clear, hx of cva, pt denies pain, attempted to void without success.

## 2015-04-10 NOTE — ED Notes (Signed)
Two unsuccessful attempts to obtain blood draw.

## 2015-04-10 NOTE — ED Notes (Addendum)
Per ems pt was on a bus coming back from the [place]. Pt got a PNA shot today. Pt told the bus drivers to stop because he was having nausea, pt has not vomiting. Pt has speech impediment from hx of stroke. Pt denies pain. Walks with a cane normally.   Upon rn assessment pt just keeps saying "sick, sick". Pt denies pain. Reports he has vomited, unable to tell me how many times.

## 2015-06-13 LAB — CBC AND DIFFERENTIAL
HEMATOCRIT: 33 % — AB (ref 41–53)
HEMOGLOBIN: 10.2 g/dL — AB (ref 13.5–17.5)
Platelets: 163 10*3/uL (ref 150–399)
WBC: 4.2 10^3/mL

## 2015-06-14 ENCOUNTER — Ambulatory Visit (INDEPENDENT_AMBULATORY_CARE_PROVIDER_SITE_OTHER): Payer: Non-veteran care

## 2015-06-14 ENCOUNTER — Ambulatory Visit (INDEPENDENT_AMBULATORY_CARE_PROVIDER_SITE_OTHER): Payer: Non-veteran care | Admitting: Emergency Medicine

## 2015-06-14 VITALS — BP 156/82 | HR 115 | Temp 98.2°F | Resp 18 | Ht 70.0 in | Wt 196.0 lb

## 2015-06-14 DIAGNOSIS — S8011XA Contusion of right lower leg, initial encounter: Secondary | ICD-10-CM

## 2015-06-14 DIAGNOSIS — S62606A Fracture of unspecified phalanx of right little finger, initial encounter for closed fracture: Secondary | ICD-10-CM

## 2015-06-14 DIAGNOSIS — S60221A Contusion of right hand, initial encounter: Secondary | ICD-10-CM | POA: Diagnosis not present

## 2015-06-14 DIAGNOSIS — R739 Hyperglycemia, unspecified: Secondary | ICD-10-CM | POA: Diagnosis not present

## 2015-06-14 DIAGNOSIS — I499 Cardiac arrhythmia, unspecified: Secondary | ICD-10-CM | POA: Diagnosis not present

## 2015-06-14 LAB — GLUCOSE, POCT (MANUAL RESULT ENTRY): POC GLUCOSE: 379 mg/dL — AB (ref 70–99)

## 2015-06-14 NOTE — Progress Notes (Addendum)
Patient ID: Collin Fox, male   DOB: 13-Oct-1945, 70 y.o.   MRN: [id number]     By signing my name below, I, Zola Button, attest that this documentation has been prepared under the direction and in the presence of Arlyss Queen, MD.  Electronically Signed: Zola Button, Medical Scribe. 06/14/2015. 2:01 PM.   Chief Complaint:  Chief Complaint  Patient presents with  . Hand Injury    right, fall last night  . Nail Problem    great toe, left foot    HPI: Collin Fox is a 70 y.o. male with a history of DM, CAD, atrial fibrillation, and stroke several years ago who reports to [facility] today complaining of sudden onset, right hand pain after he tripped and fell last night in the bedroom. He also has an abrasion to his right shin and believes he may have injured his left great toe. Patient denies chest pain. He has swelling in his ankles at baseline. He also has right-sided weakness since his stroke. PSHx includes right ankle surgery.  Patient goes to the [place] for medical care, but has not been in the past month.  Past Medical History  Diagnosis Date  . Stroke ([place])   . Diabetes mellitus without complication ([place])   . Hypertension   . Coronary artery disease   . Atrial fibrillation ([place])   . Heart attack [facility])    Past Surgical History  Procedure Laterality Date  . Coronary artery bypass graft    . Eye surgery    . Prostate surgery     Social History   Social History  . Marital Status: Single    Spouse Name: N/A  . Number of Children: N/A  . Years of Education: N/A   Social History Main Topics  . Smoking status: Never Smoker   . Smokeless tobacco: Never Used  . Alcohol Use: No  . Drug Use: No  . Sexual Activity: Not Asked   Other Topics Concern  . None   Social History Narrative   Family History  Problem Relation Age of Onset  . Diabetes Brother   . Heart disease Brother   . Hypertension Brother    No Known Allergies Prior to Admission medications   Medication Sig  Start Date End Date Taking? Authorizing Provider  amLODipine (NORVASC) 10 MG tablet Take 10 mg by mouth every morning.   Yes Historical Provider, MD  ammonium lactate (LAC-HYDRIN) 12 % lotion Apply 1 application topically daily.   Yes Historical Provider, MD  aspirin 81 MG chewable tablet Chew 81 mg by mouth every morning.   Yes Historical Provider, MD  atorvastatin (LIPITOR) 80 MG tablet Take 80 mg by mouth daily at 6 PM.   Yes Historical Provider, MD  brimonidine (ALPHAGAN) 0.2 % ophthalmic solution Place 1 drop into both eyes 3 (three) times daily.   Yes Historical Provider, MD  carbamazepine (CARBATROL) 300 MG 12 hr capsule Take 600 mg by mouth 2 (two) times daily.    Yes Historical Provider, MD  cloNIDine (CATAPRES - DOSED IN MG/24 HR) 0.2 mg/24hr patch Place 1 patch onto the skin once a week. For high blood pressure. Change patch every Sunday.   Yes Historical Provider, MD  docusate sodium (COLACE) 100 MG capsule Take 100 mg by mouth 2 (two) times daily. For constipation   Yes Historical Provider, MD  dorzolamide-timolol (COSOPT) 22.3-6.8 MG/ML ophthalmic solution Place 1 drop into both eyes daily.   Yes Historical Provider, MD  furosemide (LASIX) 40  MG tablet Take 20-40 mg by mouth daily. Takes 20 or 40 mg daily. Alternates medication strength.   Yes Historical Provider, MD  hydrALAZINE (APRESOLINE) 100 MG tablet Take 100 mg by mouth 3 (three) times daily.   Yes Historical Provider, MD  insulin NPH-insulin regular (NOVOLIN 70/30) (70-30) 100 UNIT/ML injection Inject 15-30 Units into the skin 2 (two) times daily with a meal. Inject 30 units with breakfast and 15 units with supper   Yes Historical Provider, MD  latanoprost (XALATAN) 0.005 % ophthalmic solution Place 1 drop into both eyes at bedtime.   Yes Historical Provider, MD  leuprolide, 6 Month, (ELIGARD) 45 MG injection Inject 45 mg into the skin every 6 (six) months.   Yes Historical Provider, MD  losartan (COZAAR) 100 MG tablet Take 100  mg by mouth every morning.   Yes Historical Provider, MD  metoprolol (TOPROL-XL) 200 MG 24 hr tablet Take 200 mg by mouth every morning.   Yes Historical Provider, MD  senna (SENOKOT) 8.6 MG TABS Take 1 tablet by mouth daily. For constipation   Yes Historical Provider, MD  spironolactone (ALDACTONE) 25 MG tablet Take 50 mg by mouth 2 (two) times daily.   Yes Historical Provider, MD     ROS: The patient denies fevers, chills, night sweats, unintentional weight loss, chest pain, wheezing, dyspnea on exertion, nausea, vomiting, abdominal pain, dysuria, hematuria, melena, numbness, or tingling.  All other systems have been reviewed and were otherwise negative with the exception of those mentioned in the HPI and as above.    PHYSICAL EXAM: Filed Vitals:   06/14/15 1347  BP: 156/82  Pulse: 115  Temp: 98.2 F (36.8 C)  Resp: 18   Body mass index is 28.12 kg/(m^2).   General: Alert, no acute distress HEENT:  Normocephalic, atraumatic, oropharynx patent. Eye: [facility] Cardiovascular: No murmur, but a very irregular rhythm. Respiratory: Clear to auscultation bilaterally.  No wheezes, rales, or rhonchi.  No cyanosis, no use of accessory musculature Abdominal: No organomegaly, abdomen is soft and non-tender, positive bowel sounds.  No masses. Musculoskeletal: Swelling over the base of the right 5th finger with bruising across the metacarpals. Decreased ROM, right 5th finger. There is a large scar medially, right lower leg. There is swelling of the ankle with limited ROM. There is a 1 cm area of skin abrasion, right lower leg.  Skin: No rashes. Neurologic: Expressive aphasia. Psychiatric: Patient acts appropriately throughout our interaction. Lymphatic: No cervical or submandibular lymphadenopathy     LABS:    EKG/XRAY:   Primary read interpreted by Dr. Everlene Farrier at [facility].  DG Finger Little Right - Fracture through the proximal portion of the middle phalanx of the right 5th finger which  is non-displaced. 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 This does involve the joint. DG Hand - Hand film shows the previously noted fracture in the middle phalanx, right 5th finger. Vascular calcification in the hand is present.  DG Right Ankle - There are severe degenerative changes in the right ankle with vascular calcification. 0.5 cm avulsion area, distal fibula area. Question of age.   ASSESSMENT/PLAN: Patient has a fracture of the base of the right middle phalanx at the joint. He also has an avulsion fracture   distal right fibula which may be old. He is in atrial fib with a somewhat rapid rate. I spoke with the daughter and advised them to follow-up these issues at the [place].Marland KitchenFor the ankle I applied a lace up brace. Buddy taping to  the fifth finger and these will all need follow-up.I personally performed the services described in this documentation, which was scribed in my presence. The recorded information has been reviewed and is accurate.    Gross sideeffects, risk and benefits, and alternatives of medications d/w patient. Patient is aware that all medications have potential sideeffects and we are unable to predict every sideeffect or drug-drug interaction that may occur.  Arlyss Queen MD 06/14/2015 2:01 PM

## 2015-06-14 NOTE — Patient Instructions (Signed)
Please make an appointment to be seen as soon as possible at the [facility] hospital to follow-up on #1 atrial fibrillation. #2 fracture right fifth finger. #3 suspected fracture distal fibula.

## 2015-08-06 ENCOUNTER — Ambulatory Visit (INDEPENDENT_AMBULATORY_CARE_PROVIDER_SITE_OTHER): Payer: Non-veteran care | Admitting: Family Medicine

## 2015-08-06 VITALS — BP 148/80 | HR 81 | Temp 97.5°F | Resp 20 | Ht 70.0 in | Wt 202.0 lb

## 2015-08-06 DIAGNOSIS — I8311 Varicose veins of right lower extremity with inflammation: Secondary | ICD-10-CM

## 2015-08-06 DIAGNOSIS — N183 Chronic kidney disease, stage 3 (moderate): Secondary | ICD-10-CM

## 2015-08-06 DIAGNOSIS — I739 Peripheral vascular disease, unspecified: Secondary | ICD-10-CM

## 2015-08-06 DIAGNOSIS — I872 Venous insufficiency (chronic) (peripheral): Secondary | ICD-10-CM

## 2015-08-06 DIAGNOSIS — M79674 Pain in right toe(s): Secondary | ICD-10-CM | POA: Diagnosis not present

## 2015-08-06 DIAGNOSIS — M109 Gout, unspecified: Secondary | ICD-10-CM

## 2015-08-06 DIAGNOSIS — M10071 Idiopathic gout, right ankle and foot: Secondary | ICD-10-CM

## 2015-08-06 DIAGNOSIS — E1122 Type 2 diabetes mellitus with diabetic chronic kidney disease: Secondary | ICD-10-CM

## 2015-08-06 DIAGNOSIS — I509 Heart failure, unspecified: Secondary | ICD-10-CM

## 2015-08-06 DIAGNOSIS — M25571 Pain in right ankle and joints of right foot: Secondary | ICD-10-CM

## 2015-08-06 LAB — BASIC METABOLIC PANEL
BUN: 29 mg/dL — AB (ref 7–25)
CALCIUM: 10 mg/dL (ref 8.6–10.3)
CO2: 26 mmol/L (ref 20–31)
CREATININE: 1.85 mg/dL — AB (ref 0.70–1.18)
Chloride: 108 mmol/L (ref 98–110)
GLUCOSE: 119 mg/dL — AB (ref 65–99)
POTASSIUM: 4.5 mmol/L (ref 3.5–5.3)
Sodium: 142 mmol/L (ref 135–146)

## 2015-08-06 LAB — POCT CBC
Granulocyte percent: 69.1 %G (ref 37–80)
HCT, POC: 29.5 % — AB (ref 43.5–53.7)
HEMOGLOBIN: 10 g/dL — AB (ref 14.1–18.1)
LYMPH, POC: 1.2 (ref 0.6–3.4)
MCH: 29.9 pg (ref 27–31.2)
MCHC: 34 g/dL (ref 31.8–35.4)
MCV: 87.8 fL (ref 80–97)
MID (CBC): 0.3 (ref 0–0.9)
MPV: 6.1 fL (ref 0–99.8)
PLATELET COUNT, POC: 225 10*3/uL (ref 142–424)
POC Granulocyte: 3.2 (ref 2–6.9)
POC LYMPH %: 25.5 % (ref 10–50)
POC MID %: 5.4 %M (ref 0–12)
RBC: 3.36 M/uL — AB (ref 4.69–6.13)
RDW, POC: 15.5 %
WBC: 4.7 10*3/uL (ref 4.6–10.2)

## 2015-08-06 LAB — URIC ACID: URIC ACID, SERUM: 5.4 mg/dL (ref 4.0–7.8)

## 2015-08-06 LAB — SEDIMENTATION RATE: SED RATE: 38 mm/h — AB (ref 0–20)

## 2015-08-06 MED ORDER — COLCHICINE 0.6 MG PO TABS: ORAL_TABLET | ORAL | Status: DC

## 2015-08-06 MED ORDER — DICLOFENAC SODIUM 1 % TD CREA: 1.0000 "application " | TOPICAL_CREAM | Freq: Four times a day (QID) | TRANSDERMAL | Status: AC | PRN

## 2015-08-06 NOTE — Progress Notes (Signed)
Subjective:    Patient ID: Collin Fox, male    DOB: 1945/12/22, 70 y.o.   MRN: [id number] By signing my name below, I, Judithe Modest, attest that this documentation has been prepared under the direction and in the presence of Delman Cheadle, MD. Electronically Signed: Judithe Modest, ER Scribe. 08/06/2015. 2:54 PM.  Chief Complaint  Patient presents with  . Gout    right too, painful    HPI HPI Comments: Collin Fox is a 70 y.o. male with a past hx of CVA, CAD, CHF, a. Fib, IDDM, and stage 3 CKD who presents to [facility] complaining of intermittent swelling and pain around his right large toe for the past month. He is having trouble walking because of his sx.  He has been diagnosed with gout prior and this feels similar to him - pain is localized and severe to light touch. [facility] cuts [facility] toenails occ.  He receives his medical care through the [place] in [place]. Just had an appt with his PCP recently to establish and does not have the next scheduled.  Does see an endocrinologist, cardiologist, and nephrologist through the [facility]. Is dysarthric from CVA.  Likes to walk some for exercise. Has some compression socks they got through the [facility] but are difficiult to get on and very uncomfortable. Has more edema and swelling in Rt leg > Lt leg due to vein stripping for cabg.  Pt is here with his wife today.  Their daughter Jonelle Sidle manages his medications.  Past Medical History  Diagnosis Date  . Stroke ([place])   . Diabetes mellitus without complication ([place])   . Hypertension   . Coronary artery disease   . Atrial fibrillation ([place])   . Heart attack ([place])    No Known Allergies  Current Outpatient Prescriptions on File Prior to Visit  Medication Sig Dispense Refill  . amLODipine (NORVASC) 10 MG tablet Take 10 mg by mouth every morning.    Marland Kitchen ammonium lactate (LAC-HYDRIN) 12 % lotion Apply 1 application topically daily.    Marland Kitchen aspirin 81 MG chewable tablet Chew 81 mg by mouth every morning.    Marland Kitchen  atorvastatin (LIPITOR) 80 MG tablet Take 80 mg by mouth daily at 6 PM.    . brimonidine (ALPHAGAN) 0.2 % ophthalmic solution Place 1 drop into both eyes 3 (three) times daily.    . carbamazepine (CARBATROL) 300 MG 12 hr capsule Take 600 mg by mouth 2 (two) times daily.     . cloNIDine (CATAPRES - DOSED IN MG/24 HR) 0.2 mg/24hr patch Place 1 patch onto the skin once a week. For high blood pressure. Change patch every Sunday.    . docusate sodium (COLACE) 100 MG capsule Take 100 mg by mouth 2 (two) times daily. For constipation    . dorzolamide-timolol (COSOPT) 22.3-6.8 MG/ML ophthalmic solution Place 1 drop into both eyes daily.    . furosemide (LASIX) 40 MG tablet Take 20-40 mg by mouth daily. Takes 20 or 40 mg daily. Alternates medication strength.    . hydrALAZINE (APRESOLINE) 100 MG tablet Take 100 mg by mouth 3 (three) times daily.    . insulin NPH-insulin regular (NOVOLIN 70/30) (70-30) 100 UNIT/ML injection Inject 15-30 Units into the skin 2 (two) times daily with a meal. Inject 30 units with breakfast and 15 units with supper    . latanoprost (XALATAN) 0.005 % ophthalmic solution Place 1 drop into both eyes at bedtime.    Marland Kitchen leuprolide, 6 Month, (ELIGARD) 45  MG injection Inject 45 mg into the skin every 6 (six) months.    Marland Kitchen losartan (COZAAR) 100 MG tablet Take 100 mg by mouth every morning.    . metoprolol (TOPROL-XL) 200 MG 24 hr tablet Take 200 mg by mouth every morning.    . senna (SENOKOT) 8.6 MG TABS Take 1 tablet by mouth daily. For constipation    . spironolactone (ALDACTONE) 25 MG tablet Take 50 mg by mouth 2 (two) times daily.     No current facility-administered medications on file prior to visit.   Depression screen [facility] 2/9 08/06/2015 06/14/2015 11/13/2014  Decreased Interest 0 0 0  Down, Depressed, Hopeless 0 0 0  PHQ - 2 Score 0 0 0     Review of Systems  Constitutional: Positive for activity change. Negative for fever, chills, appetite change and unexpected weight change.    Cardiovascular: Positive for leg swelling.  Gastrointestinal: Negative for diarrhea.  Musculoskeletal: Positive for myalgias, joint swelling, arthralgias and gait problem.  Skin: Positive for color change, rash and wound.  Neurological: Positive for weakness.  Psychiatric/Behavioral: Positive for confusion. Negative for dysphoric mood. The patient is not nervous/anxious.       Objective:   Physical Exam  Constitutional: He is oriented to person, place, and time. He appears well-developed and well-nourished. No distress.  HENT:  Head: Normocephalic and atraumatic.  Eyes: Pupils are equal, round, and reactive to light.  Neck: Neck supple.  Cardiovascular: Normal rate.   Venus stasis changes in bilateral lower extremities. 2+ pitting edema with ulcerations in the anterior aspect with weeping. Skin is hairless, shiny and thickened. Irregularly irregular 2/6 blowing systolic murmur.   Pulmonary/Chest: Effort normal. No respiratory distress.  Musculoskeletal: Normal range of motion.  In right foot, no warmth, isolated effusion or erythema, however the whole foot was tight with pitting edema and first MTP is hyperaesthetic.   Neurological: He is alert and oriented to person, place, and time. Coordination normal.  Skin: Skin is warm and dry. He is not diaphoretic.  toenails thickened and quite long  Psychiatric: He has a normal mood and affect. His behavior is normal.  Nursing note and vitals reviewed.     Results for orders placed or performed in visit on 08/06/15  POCT CBC  Result Value Ref Range   WBC 4.7 4.6 - 10.2 K/uL   Lymph, poc 1.2 0.6 - 3.4   POC LYMPH PERCENT 25.5 10 - 50 %L   MID (cbc) 0.3 0 - 0.9   POC MID % 5.4 0 - 12 %M   POC Granulocyte 3.2 2 - 6.9   Granulocyte percent 69.1 37 - 80 %G   RBC 3.36 (A) 4.69 - 6.13 M/uL   Hemoglobin 10.0 (A) 14.1 - 18.1 g/dL   HCT, POC 29.5 (A) 43.5 - 53.7 %   MCV 87.8 80 - 97 fL   MCH, POC 29.9 27 - 31.2 pg   MCHC 34.0 31.8 - 35.4  g/dL   RDW, POC 15.5 %   Platelet Count, POC 225 142 - 424 K/uL   MPV 6.1 0 - 99.8 fL    Assessment & Plan:   1. Toe joint pain, right - pt is hyperesthetic over toe area and has a h/o gout so will try treatment with colcrys - no po nsaids due to CKD but could try low dose top diclofenac to 1st MTP only.  No steroids due to IDDM.  2. Acute gout of right foot, unspecified cause - advised  ok to repeat colcrys 3 tab regimen once a week.  ok to repeat every 3d w/ nml renal fxn and every 2 wks if GFR <30 - pt's last GFR 4 mos prior was 38 - recheck today.  3. CKD stage 3 due to type 2 diabetes mellitus ([place])  - no nsiads; presume anemia is due to renal failure but hgb decreased to 10 from 12. Will send labs to PCP and pt and wife agree to make f/u appt with PCP asap.  4. Congestive heart failure, unspecified congestive heart failure chronicity, unspecified congestive heart failure type ([place])   5. Peripheral vascular disease ([place])   6. Venous stasis dermatitis of right lower extremity - Pt with a large amount of tight painful weeping edema in his Rt > Lt leg.  Takes lasix 40 qd, no K supp. Advised increasing lasix to bid x 3-5d but pt's daughter declined as he becomes dehydrated very easily. Pt has compression socks but does not like to wear them - uncomfortable. Advised keeping feet elevated whenever sitting, pillows under legs in bed, walking, consider obtaining different compression socks or wrapping legs with ace wraps starting at feet.  I wonder if coming off of amlodipine might be beneficial.     Orders Placed This Encounter  Procedures  . Basic metabolic panel    Order Specific Question:  Has the patient fasted?    Answer:  No  . Sedimentation Rate  . Uric Acid  . POCT CBC    Meds ordered this encounter  Medications  . colchicine 0.6 MG tablet    Sig: Take 2 tabs po x 1 now, then 1 tab an hour later. Even if gout re-flairs, do not repeat for a week.    Dispense:  30 tablet    Refill:   0  . Diclofenac Sodium 1 % CREA    Sig: Place 1 application onto the skin 4 (four) times daily as needed. To right large toe    Dispense:  120 g    Refill:  0    I personally performed the services described in this documentation, which was scribed in my presence. The recorded information has been reviewed and considered, and addended by me as needed.  Delman Cheadle, MD MPH

## 2015-08-06 NOTE — Patient Instructions (Addendum)
You can increase your lasix to twice a day for the next 3 to 5 days You can reduce your uric acid level by avoiding red meat, saturated fats (dairy fats, butter fats, animal fats), high fructose corn syrup, and beer and drinking more water.  There are some easy diet adjustments that you can make to lower your blood uric acid level and thereby hopefully never have to suffer from another gout flair (or at least less frequently).  You should avoid alcohol, drink plenty of water, and try to follow a "low purine" diet as your body produces uric acid when it breaks down prurines-substances that are found naturally in your body, as well as in certain foods such as organ meats, anchioves, herring, asparagus, and mushrooms. Also, increasing your diet in certain foods that may lower uric acid levels is a pretty safe way to decrease your likelihood of gout so consider drinking coffee (regular and/or decaf), eating fruits with Vitamin C in them such as citrus fruits, strawberries, broccoli,  brussel sprouts, papaya, and cantaloupe (though megadoses of vitamin C supplements may do the opposite and increase your body's uric acid levels), and/or eating more cherries and other dark-colored fruits, such as blackberries, blueberries, purple grapes and raspberries. In addition, getting plenty of vitamin A though yellow fruits, or dark green/yellow vegetables at least every other day is good.  Other general diet guidelines for people with gout who need to lower their blood uric acid levels are as follows:  Drink 8 to 16 cups ( about 2 to 4 liters) of fluid each day, with at least half being water. Eat a moderate amount of protein, preferably from healthy sources, such as low-fat or fat-free dairy, tofu, eggs, and nut butters. Limit your daily intake of meat, fish, and poultry to 4 to 6 ounces. Avoid high fat meats and desserts. Decrease your intake of shellfish, beef, lamb, pork, eggs and cheese. Avoid drastic weight  reduction or fasting.  If weight loss is desired lose it over a period of several months.  Gout Gout is an inflammatory arthritis caused by a buildup of uric acid crystals in the joints. Uric acid is a chemical that is normally present in the blood. When the level of uric acid in the blood is too high it can form crystals that deposit in your joints and tissues. This causes joint redness, soreness, and swelling (inflammation). Repeat attacks are common. Over time, uric acid crystals can form into masses (tophi) near a joint, destroying bone and causing disfigurement. Gout is treatable and often preventable. CAUSES  The disease begins with elevated levels of uric acid in the blood. Uric acid is produced by your body when it breaks down a naturally found substance called purines. Certain foods you eat, such as meats and fish, contain high amounts of purines. Causes of an elevated uric acid level include:  Being passed down from parent to child (heredity).  Diseases that cause increased uric acid production (such as obesity, psoriasis, and certain cancers).  Excessive alcohol use.  Diet, especially diets rich in meat and seafood.  Medicines, including certain cancer-fighting medicines (chemotherapy), water pills (diuretics), and aspirin.  Chronic kidney disease. The kidneys are no longer able to remove uric acid well.  Problems with metabolism. Conditions strongly associated with gout include:  Obesity.  High blood pressure.  High cholesterol.  Diabetes. Not everyone with elevated uric acid levels gets gout. It is not understood why some people get gout and others do not. Surgery, joint  injury, and eating too much of certain foods are some of the factors that can lead to gout attacks. SYMPTOMS   An attack of gout comes on quickly. It causes intense pain with redness, swelling, and warmth in a joint.  Fever can occur.  Often, only one joint is involved. Certain joints are more  commonly involved:  Base of the big toe.  Knee.  Ankle.  Wrist.  Finger. Without treatment, an attack usually goes away in a few days to weeks. Between attacks, you usually will not have symptoms, which is different from many other forms of arthritis. DIAGNOSIS  Your caregiver will suspect gout based on your symptoms and exam. In some cases, tests may be recommended. The tests may include:  Blood tests.  Urine tests.  X-rays.  Joint fluid exam. This exam requires a needle to remove fluid from the joint (arthrocentesis). Using a microscope, gout is confirmed when uric acid crystals are seen in the joint fluid. TREATMENT  There are two phases to gout treatment: treating the sudden onset (acute) attack and preventing attacks (prophylaxis).  Treatment of an Acute Attack.  Medicines are used. These include anti-inflammatory medicines or steroid medicines.  An injection of steroid medicine into the affected joint is sometimes necessary.  The painful joint is rested. Movement can worsen the arthritis.  You may use warm or cold treatments on painful joints, depending which works best for you.  Treatment to Prevent Attacks.  If you suffer from frequent gout attacks, your caregiver may advise preventive medicine. These medicines are started after the acute attack subsides. These medicines either help your kidneys eliminate uric acid from your body or decrease your uric acid production. You may need to stay on these medicines for a very long time.  The early phase of treatment with preventive medicine can be associated with an increase in acute gout attacks. For this reason, during the first few months of treatment, your caregiver may also advise you to take medicines usually used for acute gout treatment. Be sure you understand your caregiver's directions. Your caregiver may make several adjustments to your medicine dose before these medicines are effective.  Discuss dietary treatment  with your caregiver or dietitian. Alcohol and drinks high in sugar and fructose and foods such as meat, poultry, and seafood can increase uric acid levels. Your caregiver or dietitian can advise you on drinks and foods that should be limited. HOME CARE INSTRUCTIONS   Do not take aspirin to relieve pain. This raises uric acid levels.  Only take over-the-counter or prescription medicines for pain, discomfort, or fever as directed by your caregiver.  Rest the joint as much as possible. When in bed, keep sheets and blankets off painful areas.  Keep the affected joint raised (elevated).  Apply warm or cold treatments to painful joints. Use of warm or cold treatments depends on which works best for you.  Use crutches if the painful joint is in your leg.  Drink enough fluids to keep your urine clear or pale yellow. This helps your body get rid of uric acid. Limit alcohol, sugary drinks, and fructose drinks.  Follow your dietary instructions. Pay careful attention to the amount of protein you eat. Your daily diet should emphasize fruits, vegetables, whole grains, and fat-free or low-fat milk products. Discuss the use of coffee, vitamin C, and cherries with your caregiver or dietitian. These may be helpful in lowering uric acid levels.  Maintain a healthy body weight. SEEK MEDICAL CARE IF:  You develop diarrhea, vomiting, or any side effects from medicines.  You do not feel better in 24 hours, or you are getting worse. SEEK IMMEDIATE MEDICAL CARE IF:   Your joint becomes suddenly more tender, and you have chills or a fever. MAKE SURE YOU:   Understand these instructions.  Will watch your condition.  Will get help right away if you are not doing well or get worse.   This information is not intended to replace advice given to you by your health care provider. Make sure you discuss any questions you have with your health care provider.   Document Released: 05/24/2000 Document Revised:  06/17/2014 Document Reviewed: 01/08/2012 Elsevier Interactive Patient Education [employer].

## 2015-11-22 ENCOUNTER — Encounter: Payer: Self-pay | Admitting: Family Medicine

## 2015-12-22 ENCOUNTER — Emergency Department (HOSPITAL_COMMUNITY)
Admission: EM | Admit: 2015-12-22 | Discharge: 2015-12-23 | Disposition: A | Discharge status: Home / Self Care | Payer: Non-veteran care | Attending: Emergency Medicine | Admitting: Emergency Medicine

## 2015-12-22 ENCOUNTER — Encounter (HOSPITAL_COMMUNITY): Payer: Self-pay

## 2015-12-22 ENCOUNTER — Emergency Department (HOSPITAL_COMMUNITY): Payer: Non-veteran care

## 2015-12-22 DIAGNOSIS — I4891 Unspecified atrial fibrillation: Secondary | ICD-10-CM | POA: Diagnosis not present

## 2015-12-22 DIAGNOSIS — R4182 Altered mental status, unspecified: Secondary | ICD-10-CM | POA: Insufficient documentation

## 2015-12-22 DIAGNOSIS — Z8673 Personal history of transient ischemic attack (TIA), and cerebral infarction without residual deficits: Secondary | ICD-10-CM | POA: Diagnosis not present

## 2015-12-22 DIAGNOSIS — Z955 Presence of coronary angioplasty implant and graft: Secondary | ICD-10-CM | POA: Diagnosis not present

## 2015-12-22 DIAGNOSIS — E119 Type 2 diabetes mellitus without complications: Secondary | ICD-10-CM | POA: Diagnosis not present

## 2015-12-22 DIAGNOSIS — Z79899 Other long term (current) drug therapy: Secondary | ICD-10-CM | POA: Insufficient documentation

## 2015-12-22 DIAGNOSIS — R531 Weakness: Secondary | ICD-10-CM

## 2015-12-22 DIAGNOSIS — Z7982 Long term (current) use of aspirin: Secondary | ICD-10-CM | POA: Diagnosis not present

## 2015-12-22 DIAGNOSIS — I2510 Atherosclerotic heart disease of native coronary artery without angina pectoris: Secondary | ICD-10-CM | POA: Insufficient documentation

## 2015-12-22 DIAGNOSIS — I10 Essential (primary) hypertension: Secondary | ICD-10-CM | POA: Diagnosis not present

## 2015-12-22 DIAGNOSIS — R112 Nausea with vomiting, unspecified: Secondary | ICD-10-CM | POA: Diagnosis not present

## 2015-12-22 DIAGNOSIS — Z794 Long term (current) use of insulin: Secondary | ICD-10-CM | POA: Diagnosis not present

## 2015-12-22 LAB — RAPID URINE DRUG SCREEN, HOSP PERFORMED
Amphetamines: NOT DETECTED
BENZODIAZEPINES: NOT DETECTED
Barbiturates: NOT DETECTED
Cocaine: NOT DETECTED
Opiates: NOT DETECTED
Tetrahydrocannabinol: NOT DETECTED

## 2015-12-22 LAB — URINALYSIS, ROUTINE W REFLEX MICROSCOPIC
Bilirubin Urine: NEGATIVE
Glucose, UA: 250 mg/dL — AB
HGB URINE DIPSTICK: NEGATIVE
Ketones, ur: NEGATIVE mg/dL
LEUKOCYTES UA: NEGATIVE
Nitrite: NEGATIVE
Protein, ur: 30 mg/dL — AB
SPECIFIC GRAVITY, URINE: 1.012 (ref 1.005–1.030)
pH: 6 (ref 5.0–8.0)

## 2015-12-22 LAB — PROTIME-INR
INR: 1.18 (ref 0.00–1.49)
Prothrombin Time: 14.7 seconds (ref 11.6–15.2)

## 2015-12-22 LAB — URINE MICROSCOPIC-ADD ON

## 2015-12-22 LAB — CBC
HEMATOCRIT: 32.6 % — AB (ref 39.0–52.0)
HEMOGLOBIN: 11.3 g/dL — AB (ref 13.0–17.0)
MCH: 30.6 pg (ref 26.0–34.0)
MCHC: 34.7 g/dL (ref 30.0–36.0)
MCV: 88.3 fL (ref 78.0–100.0)
Platelets: ADEQUATE 10*3/uL (ref 150–400)
RBC: 3.69 MIL/uL — ABNORMAL LOW (ref 4.22–5.81)
RDW: 14.2 % (ref 11.5–15.5)
WBC: 5.8 10*3/uL (ref 4.0–10.5)

## 2015-12-22 LAB — DIFFERENTIAL
BASOS ABS: 0 10*3/uL (ref 0.0–0.1)
Basophils Relative: 0 %
Eosinophils Absolute: 0.1 10*3/uL (ref 0.0–0.7)
Eosinophils Relative: 1 %
LYMPHS ABS: 0.3 10*3/uL — AB (ref 0.7–4.0)
LYMPHS PCT: 6 %
MONOS PCT: 5 %
Monocytes Absolute: 0.3 10*3/uL (ref 0.1–1.0)
Neutro Abs: 5.1 10*3/uL (ref 1.7–7.7)
Neutrophils Relative %: 88 %

## 2015-12-22 LAB — I-STAT CHEM 8, ED
BUN: 45 mg/dL — ABNORMAL HIGH (ref 6–20)
CALCIUM ION: 1.11 mmol/L — AB (ref 1.12–1.23)
CREATININE: 2.2 mg/dL — AB (ref 0.61–1.24)
Chloride: 103 mmol/L (ref 101–111)
GLUCOSE: 229 mg/dL — AB (ref 65–99)
HCT: 38 % — ABNORMAL LOW (ref 39.0–52.0)
HEMOGLOBIN: 12.9 g/dL — AB (ref 13.0–17.0)
Potassium: 4.5 mmol/L (ref 3.5–5.1)
Sodium: 134 mmol/L — ABNORMAL LOW (ref 135–145)
TCO2: 22 mmol/L (ref 0–100)

## 2015-12-22 LAB — APTT: aPTT: 24 seconds (ref 24–37)

## 2015-12-22 LAB — I-STAT TROPONIN, ED: TROPONIN I, POC: 0.02 ng/mL (ref 0.00–0.08)

## 2015-12-22 LAB — ETHANOL

## 2015-12-22 MED ORDER — SODIUM CHLORIDE 0.9 % IV BOLUS (SEPSIS)
1000.0000 mL | Freq: Once | INTRAVENOUS | Status: AC
  Administered 2015-12-22: 1000 mL via INTRAVENOUS

## 2015-12-22 NOTE — ED Notes (Signed)
Pt able to move up in bed without assistance. Follow commands.

## 2015-12-22 NOTE — ED Notes (Signed)
Wife present states "almost fell" Weak at [facility] today. Pt resides at an apartment close to family. EMS called earlier for weakness. Seen and treated on scene however not transported at that time. Per wife, pt began to feel week again. [facility] and [facility] recommendation to be evaluated in [facility]. Pt did not fall. At present, per family pt is at baseline per neuro status.

## 2015-12-22 NOTE — ED Provider Notes (Signed)
CSN: [id number]     Arrival date & time 12/22/15  1746 History   First MD Initiated Contact with Patient 12/22/15 1818     Chief Complaint  Patient presents with  . Altered Mental Status     (Consider location/radiation/quality/duration/timing/severity/associated sxs/prior Treatment) HPI Comments: Patient with PMH of stroke, DM, HTN, CAD, afib on aspirin presents to the ED with a chief complaint of weakness.  He is accompanied by family members.  Patient and family members state that he has been increasingly weak over the past few days.  Pt. States that he has had several episodes of vomiting, but denies abdominal pain or diarrhea.  His family members state that he has been very weak as well, not being able to walk like normal today.  Patient does have residual dysarthria and right upper and lower extremity weakness 2/2 prior stroke.  He denies headache, fever, chills, cough, CP or SOB.  He stays at home and is normally followed by the [place].  The history is provided by the patient. No language interpreter was used.    Past Medical History  Diagnosis Date  . Stroke ([place])   . Diabetes mellitus without complication ([place])   . Hypertension   . Coronary artery disease   . Atrial fibrillation ([place])   . Heart attack [facility])    Past Surgical History  Procedure Laterality Date  . Coronary artery bypass graft    . Eye surgery    . Prostate surgery     Family History  Problem Relation Age of Onset  . Diabetes Brother   . Heart disease Brother   . Hypertension Brother    Social History  Substance Use Topics  . Smoking status: Never Smoker   . Smokeless tobacco: Never Used  . Alcohol Use: No    Review of Systems  Gastrointestinal: Positive for nausea and vomiting.  Neurological: Positive for weakness.  All other systems reviewed and are negative.     Allergies  Review of patient's allergies indicates no known allergies.  Home Medications   Prior to Admission medications    Medication Sig Start Date End Date Taking? Authorizing Provider  amLODipine (NORVASC) 10 MG tablet Take 10 mg by mouth every morning.   Yes Historical Provider, MD  aspirin 81 MG chewable tablet Chew 81 mg by mouth every morning.   Yes Historical Provider, MD  atorvastatin (LIPITOR) 80 MG tablet Take 80 mg by mouth daily at 6 PM.   Yes Historical Provider, MD  carbamazepine (CARBATROL) 300 MG 12 hr capsule Take 600 mg by mouth 2 (two) times daily.    Yes Historical Provider, MD  hydrALAZINE (APRESOLINE) 100 MG tablet Take 100 mg by mouth 3 (three) times daily.   Yes Historical Provider, MD  losartan (COZAAR) 100 MG tablet Take 100 mg by mouth every morning.   Yes Historical Provider, MD  metoprolol (TOPROL-XL) 200 MG 24 hr tablet Take 200 mg by mouth every morning.   Yes Historical Provider, MD  spironolactone (ALDACTONE) 25 MG tablet Take 50 mg by mouth 2 (two) times daily.   Yes Historical Provider, MD  ammonium lactate (LAC-HYDRIN) 12 % lotion Apply 1 application topically daily.    Historical Provider, MD  brimonidine (ALPHAGAN) 0.2 % ophthalmic solution Place 1 drop into both eyes 3 (three) times daily.    Historical Provider, MD  cloNIDine (CATAPRES - DOSED IN MG/24 HR) 0.2 mg/24hr patch Place 1 patch onto the skin once a week. For high blood pressure. Change  patch every Sunday.    Historical Provider, MD  colchicine 0.6 MG tablet Take 2 tabs po x 1 now, then 1 tab an hour later. Even if gout re-flairs, do not repeat for a week. 08/06/15   Shawnee Knapp, MD  Diclofenac Sodium 1 % CREA Place 1 application onto the skin 4 (four) times daily as needed. To right large toe 08/06/15   Shawnee Knapp, MD  docusate sodium (COLACE) 100 MG capsule Take 100 mg by mouth 2 (two) times daily. For constipation    Historical Provider, MD  dorzolamide-timolol (COSOPT) 22.3-6.8 MG/ML ophthalmic solution Place 1 drop into both eyes daily.    Historical Provider, MD  furosemide (LASIX) 40 MG tablet Take 20-40 mg by mouth  daily. Takes 20 or 40 mg daily. Alternates medication strength.    Historical Provider, MD  insulin NPH-insulin regular (NOVOLIN 70/30) (70-30) 100 UNIT/ML injection Inject 15-30 Units into the skin 2 (two) times daily with a meal. Inject 30 units with breakfast and 15 units with supper    Historical Provider, MD  latanoprost (XALATAN) 0.005 % ophthalmic solution Place 1 drop into both eyes at bedtime.    Historical Provider, MD  leuprolide, 6 Month, (ELIGARD) 45 MG injection Inject 45 mg into the skin every 6 (six) months.    Historical Provider, MD  senna (SENOKOT) 8.6 MG TABS Take 1 tablet by mouth daily as needed for moderate constipation. For constipation    Historical Provider, MD   BP 191/100 mmHg  Pulse 82  Temp(Src) 97.9 F (36.6 C) (Oral)  Resp 20  Ht 6' (1.829 m)  Wt 99.791 kg  BMI 29.83 kg/m2  SpO2 100% Physical Exam  Constitutional: He is oriented to person, place, and time. He appears well-developed and well-nourished.  HENT:  Head: Normocephalic and atraumatic.  Eyes: Conjunctivae and EOM are normal. Pupils are equal, round, and reactive to light. Right eye exhibits no discharge. Left eye exhibits no discharge. No scleral icterus.  Neck: Normal range of motion. Neck supple. No JVD present.  Cardiovascular: Normal rate, regular rhythm and normal heart sounds.  Exam reveals no gallop and no friction rub.   No murmur heard. Pulmonary/Chest: Effort normal and breath sounds normal. No respiratory distress. He has no wheezes. He has no rales. He exhibits no tenderness.  Abdominal: Soft. He exhibits no distension and no mass. There is no tenderness. There is no rebound and no guarding.  No focal abdominal tenderness, no RLQ tenderness or pain at McBurney's point, no RUQ tenderness or Murphy's sign, no left-sided abdominal tenderness, no fluid wave, or signs of peritonitis   Musculoskeletal: Normal range of motion. He exhibits no edema or tenderness.  Neurological: He is alert and  oriented to person, place, and time.  Mild deficit in right upper and lower extremities (baseline) Mild speech impediment Moves all extremities  Skin: Skin is warm and dry.  Psychiatric: He has a normal mood and affect. His behavior is normal. Judgment and thought content normal.  Nursing note and vitals reviewed.   ED Course  Procedures (including critical care time) Results for orders placed or performed during the hospital encounter of 12/22/15  Ethanol  Result Value Ref Range   Alcohol, Ethyl (B) <5 <5 mg/dL  Protime-INR  Result Value Ref Range   Prothrombin Time 14.7 11.6 - 15.2 seconds   INR 1.18 0.00 - 1.49  APTT  Result Value Ref Range   aPTT 24 24 - 37 seconds  CBC  Result  Value Ref Range   WBC 5.8 4.0 - 10.5 K/uL   RBC 3.69 (L) 4.22 - 5.81 MIL/uL   Hemoglobin 11.3 (L) 13.0 - 17.0 g/dL   HCT 32.6 (L) 39.0 - 52.0 %   MCV 88.3 78.0 - 100.0 fL   MCH 30.6 26.0 - 34.0 pg   MCHC 34.7 30.0 - 36.0 g/dL   RDW 14.2 11.5 - 15.5 %   Platelets  150 - 400 K/uL    PLATELET CLUMPS NOTED ON SMEAR, COUNT APPEARS ADEQUATE  Differential  Result Value Ref Range   Neutrophils Relative % 88 %   Lymphocytes Relative 6 %   Monocytes Relative 5 %   Eosinophils Relative 1 %   Basophils Relative 0 %   Neutro Abs 5.1 1.7 - 7.7 K/uL   Lymphs Abs 0.3 (L) 0.7 - 4.0 K/uL   Monocytes Absolute 0.3 0.1 - 1.0 K/uL   Eosinophils Absolute 0.1 0.0 - 0.7 K/uL   Basophils Absolute 0.0 0.0 - 0.1 K/uL   WBC Morphology WHITE COUNT CONFIRMED ON SMEAR    Smear Review MORPHOLOGY UNREMARKABLE   Urine rapid drug screen (hosp performed)not at [facility]  Result Value Ref Range   Opiates NONE DETECTED NONE DETECTED   Cocaine NONE DETECTED NONE DETECTED   Benzodiazepines NONE DETECTED NONE DETECTED   Amphetamines NONE DETECTED NONE DETECTED   Tetrahydrocannabinol NONE DETECTED NONE DETECTED   Barbiturates NONE DETECTED NONE DETECTED  Urinalysis, Routine w reflex microscopic (not at [facility])  Result Value Ref  Range   Color, Urine YELLOW YELLOW   APPearance CLEAR CLEAR   Specific Gravity, Urine 1.012 1.005 - 1.030   pH 6.0 5.0 - 8.0   Glucose, UA 250 (A) NEGATIVE mg/dL   Hgb urine dipstick NEGATIVE NEGATIVE   Bilirubin Urine NEGATIVE NEGATIVE   Ketones, ur NEGATIVE NEGATIVE mg/dL   Protein, ur 30 (A) NEGATIVE mg/dL   Nitrite NEGATIVE NEGATIVE   Leukocytes, UA NEGATIVE NEGATIVE  Urine microscopic-add on  Result Value Ref Range   Squamous Epithelial / LPF 0-5 (A) NONE SEEN   WBC, UA 0-5 0 - 5 WBC/hpf   RBC / HPF 0-5 0 - 5 RBC/hpf   Bacteria, UA RARE (A) NONE SEEN   Urine-Other MUCOUS PRESENT   I-Stat Chem 8, ED  (not at [facility], [facility])  Result Value Ref Range   Sodium 134 (L) 135 - 145 mmol/L   Potassium 4.5 3.5 - 5.1 mmol/L   Chloride 103 101 - 111 mmol/L   BUN 45 (H) 6 - 20 mg/dL   Creatinine, Ser 2.20 (H) 0.61 - 1.24 mg/dL   Glucose, Bld 229 (H) 65 - 99 mg/dL   Calcium, Ion 1.11 (L) 1.12 - 1.23 mmol/L   TCO2 22 0 - 100 mmol/L   Hemoglobin 12.9 (L) 13.0 - 17.0 g/dL   HCT 38.0 (L) 39.0 - 52.0 %  I-stat troponin, ED (not at [facility], [facility])  Result Value Ref Range   Troponin i, poc 0.02 0.00 - 0.08 ng/mL   Comment 3           Ct Head Wo Contrast  12/22/2015  CLINICAL DATA:  70 year old male with altered mental status. EXAM: CT HEAD WITHOUT CONTRAST TECHNIQUE: Contiguous axial images were obtained from the base of the skull through the vertex without intravenous contrast. COMPARISON:  Head CT dated 05/30/2009 FINDINGS: There is no acute intracranial hemorrhage. Large area of old infarct and encephalomalacia noted involving the left frontal, temporal, and parietal lobe in distribution of  the left MCA territory. Stable old right frontal and encephalomalacia noted. There is mild age-related atrophy. There is no mass effect or midline shift. The visualized paranasal sinuses and mastoid air cells are clear. The calvarium is intact. Stable postsurgical changes of the right globe IMPRESSION: No acute  intracranial hemorrhage. Stable large left MCA as well as stable right ACA old infarcts. Electronically Signed   By: Anner Crete M.D.   On: 12/22/2015 20:01    I have personally reviewed and evaluated these images and lab results as part of my medical decision-making.   MDM   Final diagnoses:  Weakness  Non-intractable vomiting with nausea, vomiting of unspecified type    Patient with generalized weakness.  Symptoms started today.  Has had some vomiting.  Hx of stroke and right sided weakness and dysarthria.    Will check labs and head CT to rule out new stroke.  Weakness may just be secondary to dehydration or low volume.  Patient seen by and discussed with Dr. Eulis Foster.  Labs and workup reviewed with him.  Dr. Eulis Foster recommends re-evaluating after ambulating, and PO challenge.  If feels back to baseline and pending labs are normal then DC to home.  12:15 AM Patient ambulates at baseline.  States that he is feeling well.  Discussed plan with patient and family members, who understand and agree with the plan.    Montine Circle, PA-C 12/23/15 [id number]  Daleen Bo, MD 12/23/15 [id number]  Daleen Bo, MD 12/23/15 [phone]

## 2015-12-22 NOTE — ED Notes (Signed)
Main lab called for blood work

## 2015-12-22 NOTE — ED Notes (Signed)
Pt attempted urine sample with assistance by standing with urinal. Max assist. Not able to go at this time

## 2015-12-22 NOTE — ED Notes (Signed)
Delay in blood draw due to pt needed an ultrasound IV.  Nurse will attempt to draw labs at the time of IV placement.

## 2015-12-22 NOTE — ED Provider Notes (Signed)
Collin Fox is a 70 y.o. male  Face-to-face evaluation   History:  Presenting for evaluation of weakness. Per family members, he is at his normal baseline mental status. Today he was feeling weak, and dizzy, described as a lightheaded feeling. He also had several episodes of vomiting without diarrhea.  Physical exam: Alert, elderly man with dysarthria, chronic, and right-sided weakness. Abdomen is soft and nontender to palpation.  Medical screening examination/treatment/procedure(s) were conducted as a shared visit with non-physician practitioner(s) and myself.  I personally evaluated the patient during the encounter  Daleen Bo, MD 12/23/15 [phone]

## 2015-12-22 NOTE — ED Notes (Signed)
Pt was sluggish, fell and not acting right.  EMS called and came to home.  Pt was acting better. Did not get transport to hospital.  [facility]  [facility]ed [facility] and told to come here.  Pt has difficulty talking.  Pt states he can write things.

## 2015-12-23 NOTE — Discharge Instructions (Signed)

## 2015-12-23 NOTE — ED Notes (Signed)
Pt walk from the the pt room to the end of hall and back to the room with assist

## 2016-01-21 ENCOUNTER — Encounter (HOSPITAL_COMMUNITY): Payer: Self-pay | Admitting: Emergency Medicine

## 2016-01-21 ENCOUNTER — Emergency Department (HOSPITAL_COMMUNITY): Payer: Non-veteran care

## 2016-01-21 ENCOUNTER — Emergency Department (HOSPITAL_COMMUNITY)
Admission: EM | Admit: 2016-01-21 | Discharge: 2016-01-21 | Disposition: A | Discharge status: Home / Self Care | Payer: Non-veteran care | Attending: Emergency Medicine | Admitting: Emergency Medicine

## 2016-01-21 DIAGNOSIS — I110 Hypertensive heart disease with heart failure: Secondary | ICD-10-CM | POA: Insufficient documentation

## 2016-01-21 DIAGNOSIS — R531 Weakness: Secondary | ICD-10-CM | POA: Diagnosis present

## 2016-01-21 DIAGNOSIS — I509 Heart failure, unspecified: Secondary | ICD-10-CM | POA: Diagnosis not present

## 2016-01-21 DIAGNOSIS — E119 Type 2 diabetes mellitus without complications: Secondary | ICD-10-CM | POA: Insufficient documentation

## 2016-01-21 DIAGNOSIS — I2510 Atherosclerotic heart disease of native coronary artery without angina pectoris: Secondary | ICD-10-CM | POA: Diagnosis not present

## 2016-01-21 DIAGNOSIS — R4182 Altered mental status, unspecified: Secondary | ICD-10-CM | POA: Diagnosis not present

## 2016-01-21 LAB — CBC WITH DIFFERENTIAL/PLATELET
BASOS ABS: 0 10*3/uL (ref 0.0–0.1)
Basophils Relative: 0 %
EOS ABS: 0.2 10*3/uL (ref 0.0–0.7)
EOS PCT: 3 %
HCT: 34.9 % — ABNORMAL LOW (ref 39.0–52.0)
Hemoglobin: 11.3 g/dL — ABNORMAL LOW (ref 13.0–17.0)
LYMPHS ABS: 0.5 10*3/uL — AB (ref 0.7–4.0)
Lymphocytes Relative: 9 %
MCH: 28.6 pg (ref 26.0–34.0)
MCHC: 32.4 g/dL (ref 30.0–36.0)
MCV: 88.4 fL (ref 78.0–100.0)
MONO ABS: 0.3 10*3/uL (ref 0.1–1.0)
Monocytes Relative: 5 %
Neutro Abs: 4.3 10*3/uL (ref 1.7–7.7)
Neutrophils Relative %: 83 %
PLATELETS: UNDETERMINED 10*3/uL (ref 150–400)
RBC: 3.95 MIL/uL — AB (ref 4.22–5.81)
RDW: 13.9 % (ref 11.5–15.5)
WBC: 5.3 10*3/uL (ref 4.0–10.5)

## 2016-01-21 LAB — COMPREHENSIVE METABOLIC PANEL
ALT: 28 U/L (ref 17–63)
AST: 29 U/L (ref 15–41)
Albumin: 4.4 g/dL (ref 3.5–5.0)
Alkaline Phosphatase: 114 U/L (ref 38–126)
Anion gap: 7 (ref 5–15)
BUN: 42 mg/dL — AB (ref 6–20)
CHLORIDE: 105 mmol/L (ref 101–111)
CO2: 24 mmol/L (ref 22–32)
Calcium: 9.9 mg/dL (ref 8.9–10.3)
Creatinine, Ser: 1.92 mg/dL — ABNORMAL HIGH (ref 0.61–1.24)
GFR calc Af Amer: 39 mL/min — ABNORMAL LOW (ref >60)
GFR, EST NON AFRICAN AMERICAN: 34 mL/min — AB (ref >60)
Glucose, Bld: 206 mg/dL — ABNORMAL HIGH (ref 65–99)
POTASSIUM: 5.1 mmol/L (ref 3.5–5.1)
Sodium: 136 mmol/L (ref 135–145)
Total Bilirubin: 0.5 mg/dL (ref 0.3–1.2)
Total Protein: 8.6 g/dL — ABNORMAL HIGH (ref 6.5–8.1)

## 2016-01-21 LAB — I-STAT CG4 LACTIC ACID, ED: LACTIC ACID, VENOUS: 1.95 mmol/L — AB (ref 0.5–1.9)

## 2016-01-21 LAB — URINALYSIS, ROUTINE W REFLEX MICROSCOPIC
Bilirubin Urine: NEGATIVE
GLUCOSE, UA: 250 mg/dL — AB
HGB URINE DIPSTICK: NEGATIVE
Ketones, ur: NEGATIVE mg/dL
LEUKOCYTES UA: NEGATIVE
NITRITE: NEGATIVE
PH: 6 (ref 5.0–8.0)
Protein, ur: 30 mg/dL — AB
Specific Gravity, Urine: 1.015 (ref 1.005–1.030)

## 2016-01-21 LAB — LIPASE, BLOOD: LIPASE: 19 U/L (ref 11–51)

## 2016-01-21 LAB — URINE MICROSCOPIC-ADD ON
BACTERIA UA: NONE SEEN
Squamous Epithelial / LPF: NONE SEEN
WBC, UA: NONE SEEN WBC/hpf (ref 0–5)

## 2016-01-21 LAB — CBG MONITORING, ED: Glucose-Capillary: 178 mg/dL — ABNORMAL HIGH (ref 65–99)

## 2016-01-21 NOTE — ED Provider Notes (Signed)
[facility] DEPT Provider Note   CSN: [id number] Arrival date & time: 01/21/16  1702  First Provider Contact:  None       History   Chief Complaint Chief Complaint  Patient presents with  . Weakness    HPI MACALLAN PEREGRINO is a 70 y.o. male.  The history is provided by the patient, the spouse, a relative and medical records.   70 yo M with PMHx of HTN, CAD, AFib on ASA, and h/o recurrent episodes of chocking/AMS who presents with choking episode and weakness x 1 day. See HPI above. Per report from pt and pt's daughter and wife, the pt was in his usual state of health today. He was visiting with family members (not his wife or daughter) when he reportedly began to choke and cough. The family became very concerned and called EMS, who brought him to the ED. The episode lasted <1 minute and pt is now at his baseline state of health. Per pt's family, the pt has an extensive history of similar episodes and this happens frequently, but only when he is with other family members. He has a h/o stroke and this has occurred since the stroke. Normally pt is able to calm down and return to baseline state of health, but when he is with his other family members, daughter states everyone "freaks out" and calls EMS. Currently, pt is without complaints. He has had no recent fever or chills. Denies any new numbness or weakness.  Past Medical History:  Diagnosis Date  . Atrial fibrillation ([place])   . Coronary artery disease   . Diabetes mellitus without complication ([place])   . Heart attack ([place])   . Hypertension   . Stroke [facility])     Patient Active Problem List   Diagnosis Date Noted  . Stroke ([place]) 11/13/2014  . Hypertension 05/24/2012  . CAP (community acquired pneumonia) 05/23/2012  . CHF (congestive heart failure) ([place]) 05/23/2012  . Renal insufficiency 05/23/2012  . Sepsis ([place]) 05/23/2012    Past Surgical History:  Procedure Laterality Date  . CORONARY ARTERY BYPASS GRAFT    . EYE  SURGERY    . PROSTATE SURGERY         Home Medications    Prior to Admission medications   Medication Sig Start Date End Date Taking? Authorizing Provider  amLODipine (NORVASC) 10 MG tablet Take 10 mg by mouth every morning.   Yes Historical Provider, MD  ammonium lactate (LAC-HYDRIN) 12 % lotion Apply 1 application topically daily.   Yes Historical Provider, MD  aspirin 81 MG chewable tablet Chew 81 mg by mouth every morning.   Yes Historical Provider, MD  atorvastatin (LIPITOR) 80 MG tablet Take 80 mg by mouth daily at 6 PM.   Yes Historical Provider, MD  brimonidine (ALPHAGAN) 0.2 % ophthalmic solution Place 1 drop into both eyes 3 (three) times daily.   Yes Historical Provider, MD  carbamazepine (CARBATROL) 300 MG 12 hr capsule Take 600 mg by mouth 2 (two) times daily.    Yes Historical Provider, MD  cloNIDine (CATAPRES - DOSED IN MG/24 HR) 0.2 mg/24hr patch Place 1 patch onto the skin every 3 (three) days.    Yes Historical Provider, MD  colchicine 0.6 MG tablet Take 2 tabs po x 1 now, then 1 tab an hour later. Even if gout re-flairs, do not repeat for a week. Patient taking differently: Take 0.6 mg by mouth daily as needed (gout flare ups).  08/06/15  Yes Shawnee Knapp,  MD  Diclofenac Sodium 1 % CREA Place 1 application onto the skin 4 (four) times daily as needed. To right large toe Patient taking differently: Place 1 application onto the skin 4 (four) times daily as needed (pain). To right large toe 08/06/15  Yes Shawnee Knapp, MD  docusate sodium (COLACE) 100 MG capsule Take 100 mg by mouth 2 (two) times daily. For constipation   Yes Historical Provider, MD  dorzolamide-timolol (COSOPT) 22.3-6.8 MG/ML ophthalmic solution Place 1 drop into both eyes daily.   Yes Historical Provider, MD  furosemide (LASIX) 40 MG tablet Take 40 mg by mouth as directed. Take 40mg  every morning, and then takes 40mg  in the afternoon every other day   Yes Historical Provider, MD  hydrALAZINE (APRESOLINE) 100 MG  tablet Take 100 mg by mouth 3 (three) times daily.   Yes Historical Provider, MD  insulin NPH-insulin regular (NOVOLIN 70/30) (70-30) 100 UNIT/ML injection Inject 12-33 Units into the skin 2 (two) times daily with a meal. Inject 32 units with breakfast and 12 units with supper   Yes Historical Provider, MD  latanoprost (XALATAN) 0.005 % ophthalmic solution Place 1 drop into both eyes at bedtime.   Yes Historical Provider, MD  leuprolide, 6 Month, (ELIGARD) 45 MG injection Inject 45 mg into the skin every 6 (six) months.   Yes Historical Provider, MD  losartan (COZAAR) 100 MG tablet Take 100 mg by mouth every morning.   Yes Historical Provider, MD  metoprolol (TOPROL-XL) 200 MG 24 hr tablet Take 200 mg by mouth every morning.   Yes Historical Provider, MD  senna (SENOKOT) 8.6 MG TABS Take 1 tablet by mouth daily. For constipation    Yes Historical Provider, MD  spironolactone (ALDACTONE) 25 MG tablet Take 50 mg by mouth 2 (two) times daily.   Yes Historical Provider, MD    Family History Family History  Problem Relation Age of Onset  . Diabetes Brother   . Heart disease Brother   . Hypertension Brother     Social History Social History  Substance Use Topics  . Smoking status: Never Smoker  . Smokeless tobacco: Never Used  . Alcohol use No     Allergies   Review of patient's allergies indicates no known allergies.   Review of Systems Review of Systems  Constitutional: Positive for fatigue. Negative for chills and fever.  HENT: Negative for congestion and rhinorrhea.   Eyes: Negative for visual disturbance.  Respiratory: Negative for cough, shortness of breath and wheezing.   Cardiovascular: Negative for chest pain and leg swelling.  Gastrointestinal: Negative for abdominal pain, diarrhea, nausea and vomiting.  Genitourinary: Negative for dysuria and flank pain.  Musculoskeletal: Negative for neck pain and neck stiffness.  Skin: Negative for rash and wound.    Allergic/Immunologic: Negative for immunocompromised state.  Neurological: Negative for syncope, weakness and headaches.  All other systems reviewed and are negative.    Physical Exam Updated Vital Signs BP (!) 187/112 (BP Location: Right Arm)   Pulse 74   Temp 97.7 F (36.5 C) (Oral)   Resp 15   Ht 6' (1.829 m)   Wt 225 lb (102.1 kg)   SpO2 98%   BMI 30.52 kg/m   Physical Exam  Constitutional: He is oriented to person, place, and time. He appears well-developed and well-nourished. No distress.  HENT:  Head: Normocephalic and atraumatic.  Mouth/Throat: Oropharynx is clear and moist.  Eyes: Conjunctivae are normal. Pupils are equal, round, and reactive to light.  Neck:  Neck supple.  Cardiovascular: Normal rate, regular rhythm and normal heart sounds.  Exam reveals no friction rub.   No murmur heard. Pulmonary/Chest: Effort normal and breath sounds normal. No respiratory distress. He has no wheezes. He has no rales.  Abdominal: He exhibits no distension. There is no tenderness.  Musculoskeletal: He exhibits no edema.  Neurological: He is alert and oriented to person, place, and time. He exhibits normal muscle tone.  Right facial droop. Speech mildly dysarthric but comprehensible. Strength at least antigravity in b/l UE.  Skin: Skin is warm. Capillary refill takes less than 2 seconds.  Nursing note and vitals reviewed.    ED Treatments / Results  Labs (all labs ordered are listed, but only abnormal results are displayed) Labs Reviewed  CBC WITH DIFFERENTIAL/PLATELET - Abnormal; Notable for the following:       Result Value   RBC 3.95 (*)    Hemoglobin 11.3 (*)    HCT 34.9 (*)    Lymphs Abs 0.5 (*)    All other components within normal limits  COMPREHENSIVE METABOLIC PANEL - Abnormal; Notable for the following:    Glucose, Bld 206 (*)    BUN 42 (*)    Creatinine, Ser 1.92 (*)    Total Protein 8.6 (*)    GFR calc non Af Amer 34 (*)    GFR calc Af Amer 39 (*)     All other components within normal limits  URINALYSIS, ROUTINE W REFLEX MICROSCOPIC (NOT AT [facility]) - Abnormal; Notable for the following:    Glucose, UA 250 (*)    Protein, ur 30 (*)    All other components within normal limits  CBG MONITORING, ED - Abnormal; Notable for the following:    Glucose-Capillary 178 (*)    All other components within normal limits  I-STAT CG4 LACTIC ACID, ED - Abnormal; Notable for the following:    Lactic Acid, Venous 1.95 (*)    All other components within normal limits  LIPASE, BLOOD  URINE MICROSCOPIC-ADD ON    EKG  EKG Interpretation None       Radiology Ct Head Wo Contrast  Result Date: 01/21/2016 CLINICAL DATA:  Nausea and vomiting for 1 day, history of prior left MCA infarct EXAM: CT HEAD WITHOUT CONTRAST TECHNIQUE: Contiguous axial images were obtained from the base of the skull through the vertex without intravenous contrast. COMPARISON:  12/22/2015 FINDINGS: Bony calvarium is intact. Changes of prior left MCA infarct are again noted and stable. Prior right frontal infarct is noted as well. Scattered areas of decreased attenuation are noted consistent with chronic white matter ischemic change. Mild atrophic changes are noted as well. No acute hemorrhage or acute infarct is identified. IMPRESSION: Stable left MCA and right ACA infarcts. Chronic atrophic changes and ischemic changes. No acute abnormality noted. Electronically Signed   By: Inez Catalina M.D.   On: 01/21/2016 18:22   Dg Chest Portable 1 View  Result Date: 01/21/2016 CLINICAL DATA:  70 year old male with nausea vomiting and weakness for 1 day. Initial encounter. EXAM: PORTABLE CHEST 1 VIEW COMPARISON:  Chest and abdomen series 04/10/15 and earlier. FINDINGS: Portable AP semi upright view at 1758 hours. Chronically low lung volumes. Stable cardiomegaly and mediastinal contours. Stable sequelae of CABG. Patchy bibasilar opacity has not definitely changed since 2016. No superimposed  pneumothorax or pleural effusion. No overt edema. Negative visible bowel gas pattern. IMPRESSION: Low lung volumes and patchy bibasilar opacity similar to that in 2016. No definite acute cardiopulmonary abnormality. PA  and lateral views of the chest would be helpful when possible. Electronically Signed   By: Genevie Ann M.D.   On: 01/21/2016 18:16    Procedures Procedures (including critical care time)  Medications Ordered in ED Medications - No data to display   Initial Impression / Assessment and Plan / ED Course  I have reviewed the triage vital signs and the nursing notes.  Pertinent labs & imaging results that were available during my care of the patient were reviewed by me and considered in my medical decision making (see chart for details).  Clinical Course   70 yo AAM with PMHx of AFib, HTN, HLD, CVA who p/w transient coughing versus choking episode with nausea, now resolved. History of the same. On arrival, VSS and WNL for pt. Exam is as above. He has no new focal neurological deficits. Based on full discussion with pt, wife, and daughter, this seems to be a chronic, recurrent issue. Family believes it may be due to what he eats versus anxiety as it only occurs around other family members. He is o/w well and at his baseline state of health. Will check screening labs but suspect pt can otherwise return home. He has had no recent fever, chills, or infectious sx. He is at his baseline state of health.   Labs, imaging as above. CT head shows NAICA. CXR is clear. CBC shows mild chronic baseline anemia. LA 1.95, which is baseline. UA shows no UTI. CMP with baseline CKD, no acute abnormalities. Pt has returned to baseline state of health and is tolerating PO. Discussed labs, findings with family. At this time, based on return to baseline, with normal labs, reassuring neuro exam, feel pt can d/c home. I expressed concern that this could be related to his h/o stroke and possible dysphagia, as his  stroke did involve his speech. Advised outpt speech referral and swallow study. Otherwise, return precautions given and will d/c home.  Final Clinical Impressions(s) / ED Diagnoses   Final diagnoses:  Generalized weakness    New Prescriptions Discharge Medication List as of 01/21/2016  9:01 PM       Duffy Bruce, MD 01/22/16 1047

## 2016-01-21 NOTE — ED Notes (Signed)
Phlebotomy called to collect remaining lab work.

## 2016-01-21 NOTE — ED Notes (Signed)
MD at bedside. 

## 2016-01-21 NOTE — ED Triage Notes (Addendum)
Per EMS, pt from home c/o N/V and weakness x1 day. Pt unable to ambulate. Hx stroke. Slurred speech and right sided weakness at baseline. 4mg  Zofran with EMS.

## 2016-01-21 NOTE — ED Notes (Signed)
Nurse is going to start IV on the patient.

## 2016-01-21 NOTE — ED Notes (Signed)
Patient transported to CT 

## 2016-01-21 NOTE — ED Notes (Signed)
XR at bedside

## 2016-05-07 ENCOUNTER — Emergency Department (HOSPITAL_COMMUNITY): Payer: Medicare Other

## 2016-05-07 ENCOUNTER — Inpatient Hospital Stay (HOSPITAL_COMMUNITY)
Admission: EM | Admit: 2016-05-07 | Discharge: 2016-05-10 | DRG: 682 | Disposition: A | Discharge status: Skilled Nursing Facility | Payer: Medicare Other | Attending: Family Medicine | Admitting: Family Medicine

## 2016-05-07 ENCOUNTER — Observation Stay (HOSPITAL_COMMUNITY): Payer: Medicare Other

## 2016-05-07 ENCOUNTER — Encounter (HOSPITAL_COMMUNITY): Payer: Self-pay

## 2016-05-07 DIAGNOSIS — I10 Essential (primary) hypertension: Secondary | ICD-10-CM

## 2016-05-07 DIAGNOSIS — G9340 Encephalopathy, unspecified: Secondary | ICD-10-CM | POA: Diagnosis present

## 2016-05-07 DIAGNOSIS — N179 Acute kidney failure, unspecified: Principal | ICD-10-CM | POA: Diagnosis present

## 2016-05-07 DIAGNOSIS — Z794 Long term (current) use of insulin: Secondary | ICD-10-CM

## 2016-05-07 DIAGNOSIS — I130 Hypertensive heart and chronic kidney disease with heart failure and stage 1 through stage 4 chronic kidney disease, or unspecified chronic kidney disease: Secondary | ICD-10-CM | POA: Diagnosis present

## 2016-05-07 DIAGNOSIS — Z7982 Long term (current) use of aspirin: Secondary | ICD-10-CM

## 2016-05-07 DIAGNOSIS — I252 Old myocardial infarction: Secondary | ICD-10-CM

## 2016-05-07 DIAGNOSIS — I509 Heart failure, unspecified: Secondary | ICD-10-CM | POA: Diagnosis present

## 2016-05-07 DIAGNOSIS — Z8249 Family history of ischemic heart disease and other diseases of the circulatory system: Secondary | ICD-10-CM

## 2016-05-07 DIAGNOSIS — Z951 Presence of aortocoronary bypass graft: Secondary | ICD-10-CM | POA: Diagnosis not present

## 2016-05-07 DIAGNOSIS — N183 Chronic kidney disease, stage 3 unspecified: Secondary | ICD-10-CM

## 2016-05-07 DIAGNOSIS — R4182 Altered mental status, unspecified: Secondary | ICD-10-CM | POA: Diagnosis not present

## 2016-05-07 DIAGNOSIS — E1165 Type 2 diabetes mellitus with hyperglycemia: Secondary | ICD-10-CM | POA: Diagnosis present

## 2016-05-07 DIAGNOSIS — I69320 Aphasia following cerebral infarction: Secondary | ICD-10-CM

## 2016-05-07 DIAGNOSIS — I69322 Dysarthria following cerebral infarction: Secondary | ICD-10-CM

## 2016-05-07 DIAGNOSIS — D649 Anemia, unspecified: Secondary | ICD-10-CM | POA: Diagnosis present

## 2016-05-07 DIAGNOSIS — E860 Dehydration: Secondary | ICD-10-CM | POA: Diagnosis present

## 2016-05-07 DIAGNOSIS — C61 Malignant neoplasm of prostate: Secondary | ICD-10-CM | POA: Diagnosis present

## 2016-05-07 DIAGNOSIS — I4891 Unspecified atrial fibrillation: Secondary | ICD-10-CM | POA: Diagnosis present

## 2016-05-07 DIAGNOSIS — E1122 Type 2 diabetes mellitus with diabetic chronic kidney disease: Secondary | ICD-10-CM | POA: Diagnosis present

## 2016-05-07 DIAGNOSIS — I480 Paroxysmal atrial fibrillation: Secondary | ICD-10-CM | POA: Diagnosis present

## 2016-05-07 DIAGNOSIS — Z8546 Personal history of malignant neoplasm of prostate: Secondary | ICD-10-CM

## 2016-05-07 DIAGNOSIS — Z833 Family history of diabetes mellitus: Secondary | ICD-10-CM

## 2016-05-07 DIAGNOSIS — I69351 Hemiplegia and hemiparesis following cerebral infarction affecting right dominant side: Secondary | ICD-10-CM | POA: Diagnosis not present

## 2016-05-07 DIAGNOSIS — M109 Gout, unspecified: Secondary | ICD-10-CM | POA: Diagnosis present

## 2016-05-07 DIAGNOSIS — K5900 Constipation, unspecified: Secondary | ICD-10-CM | POA: Diagnosis present

## 2016-05-07 DIAGNOSIS — K5909 Other constipation: Secondary | ICD-10-CM | POA: Diagnosis present

## 2016-05-07 DIAGNOSIS — I2510 Atherosclerotic heart disease of native coronary artery without angina pectoris: Secondary | ICD-10-CM | POA: Diagnosis present

## 2016-05-07 HISTORY — DX: Anxiety disorder, unspecified: F41.9

## 2016-05-07 HISTORY — DX: Heart failure, unspecified: I50.9

## 2016-05-07 HISTORY — DX: Anemia, unspecified: D64.9

## 2016-05-07 HISTORY — DX: Chronic kidney disease, unspecified: N18.9

## 2016-05-07 HISTORY — DX: Unspecified convulsions: R56.9

## 2016-05-07 HISTORY — DX: Type 2 diabetes mellitus without complications: E11.9

## 2016-05-07 HISTORY — DX: Malignant neoplasm of prostate: C61

## 2016-05-07 HISTORY — DX: Pure hypercholesterolemia, unspecified: E78.00

## 2016-05-07 LAB — URINALYSIS, ROUTINE W REFLEX MICROSCOPIC
Bilirubin Urine: NEGATIVE
HGB URINE DIPSTICK: NEGATIVE
Ketones, ur: NEGATIVE mg/dL
Leukocytes, UA: NEGATIVE
Nitrite: NEGATIVE
PROTEIN: NEGATIVE mg/dL
SPECIFIC GRAVITY, URINE: 1.014 (ref 1.005–1.030)
pH: 6.5 (ref 5.0–8.0)

## 2016-05-07 LAB — CBC
HEMATOCRIT: 35.9 % — AB (ref 39.0–52.0)
Hemoglobin: 11.9 g/dL — ABNORMAL LOW (ref 13.0–17.0)
MCH: 28.8 pg (ref 26.0–34.0)
MCHC: 33.1 g/dL (ref 30.0–36.0)
MCV: 86.9 fL (ref 78.0–100.0)
Platelets: 177 10*3/uL (ref 150–400)
RBC: 4.13 MIL/uL — ABNORMAL LOW (ref 4.22–5.81)
RDW: 13.7 % (ref 11.5–15.5)
WBC: 5 10*3/uL (ref 4.0–10.5)

## 2016-05-07 LAB — RAPID URINE DRUG SCREEN, HOSP PERFORMED
AMPHETAMINES: NOT DETECTED
Barbiturates: NOT DETECTED
Benzodiazepines: NOT DETECTED
Cocaine: NOT DETECTED
OPIATES: NOT DETECTED
TETRAHYDROCANNABINOL: NOT DETECTED

## 2016-05-07 LAB — COMPREHENSIVE METABOLIC PANEL
ALBUMIN: 3.7 g/dL (ref 3.5–5.0)
ALT: 33 U/L (ref 17–63)
AST: 28 U/L (ref 15–41)
Alkaline Phosphatase: 92 U/L (ref 38–126)
Anion gap: 10 (ref 5–15)
BUN: 80 mg/dL — AB (ref 6–20)
CHLORIDE: 104 mmol/L (ref 101–111)
CO2: 22 mmol/L (ref 22–32)
Calcium: 10.3 mg/dL (ref 8.9–10.3)
Creatinine, Ser: 3.11 mg/dL — ABNORMAL HIGH (ref 0.61–1.24)
GFR calc Af Amer: 22 mL/min — ABNORMAL LOW (ref >60)
GFR, EST NON AFRICAN AMERICAN: 19 mL/min — AB (ref >60)
Glucose, Bld: 382 mg/dL — ABNORMAL HIGH (ref 65–99)
POTASSIUM: 4.1 mmol/L (ref 3.5–5.1)
SODIUM: 136 mmol/L (ref 135–145)
Total Bilirubin: 0.7 mg/dL (ref 0.3–1.2)
Total Protein: 7.1 g/dL (ref 6.5–8.1)

## 2016-05-07 LAB — GLUCOSE, CAPILLARY: Glucose-Capillary: 148 mg/dL — ABNORMAL HIGH (ref 65–99)

## 2016-05-07 LAB — CBC WITH DIFFERENTIAL/PLATELET
BASOS ABS: 0 10*3/uL (ref 0.0–0.1)
Basophils Relative: 0 %
EOS ABS: 0 10*3/uL (ref 0.0–0.7)
EOS PCT: 0 %
HCT: 37.2 % — ABNORMAL LOW (ref 39.0–52.0)
Hemoglobin: 12.2 g/dL — ABNORMAL LOW (ref 13.0–17.0)
Lymphocytes Relative: 9 %
Lymphs Abs: 0.5 10*3/uL — ABNORMAL LOW (ref 0.7–4.0)
MCH: 28.3 pg (ref 26.0–34.0)
MCHC: 32.8 g/dL (ref 30.0–36.0)
MCV: 86.3 fL (ref 78.0–100.0)
MONO ABS: 0.3 10*3/uL (ref 0.1–1.0)
Monocytes Relative: 5 %
Neutro Abs: 4.3 10*3/uL (ref 1.7–7.7)
Neutrophils Relative %: 86 %
PLATELETS: 177 10*3/uL (ref 150–400)
RBC: 4.31 MIL/uL (ref 4.22–5.81)
RDW: 13.6 % (ref 11.5–15.5)
WBC: 5.1 10*3/uL (ref 4.0–10.5)

## 2016-05-07 LAB — CBG MONITORING, ED: GLUCOSE-CAPILLARY: 362 mg/dL — AB (ref 65–99)

## 2016-05-07 LAB — I-STAT VENOUS BLOOD GAS, ED
ACID-BASE EXCESS: 3 mmol/L — AB (ref 0.0–2.0)
BICARBONATE: 26.1 mmol/L (ref 20.0–28.0)
O2 Saturation: 97 %
PO2 VEN: 87 mmHg — AB (ref 32.0–45.0)
TCO2: 27 mmol/L (ref 0–100)
pCO2, Ven: 34.9 mmHg — ABNORMAL LOW (ref 44.0–60.0)
pH, Ven: 7.482 — ABNORMAL HIGH (ref 7.250–7.430)

## 2016-05-07 LAB — I-STAT CHEM 8, ED
BUN: 70 mg/dL — ABNORMAL HIGH (ref 6–20)
CREATININE: 2.9 mg/dL — AB (ref 0.61–1.24)
Calcium, Ion: 1.23 mmol/L (ref 1.15–1.40)
Chloride: 104 mmol/L (ref 101–111)
Glucose, Bld: 390 mg/dL — ABNORMAL HIGH (ref 65–99)
HEMATOCRIT: 40 % (ref 39.0–52.0)
HEMOGLOBIN: 13.6 g/dL (ref 13.0–17.0)
Potassium: 4.1 mmol/L (ref 3.5–5.1)
SODIUM: 138 mmol/L (ref 135–145)
TCO2: 25 mmol/L (ref 0–100)

## 2016-05-07 LAB — CREATININE, SERUM
Creatinine, Ser: 2.87 mg/dL — ABNORMAL HIGH (ref 0.61–1.24)
GFR, EST AFRICAN AMERICAN: 24 mL/min — AB (ref >60)
GFR, EST NON AFRICAN AMERICAN: 21 mL/min — AB (ref >60)

## 2016-05-07 LAB — URINE MICROSCOPIC-ADD ON
BACTERIA UA: NONE SEEN
WBC UA: NONE SEEN WBC/hpf (ref 0–5)

## 2016-05-07 LAB — TROPONIN I
TROPONIN I: 0.04 ng/mL — AB (ref <0.03)
Troponin I: 0.05 ng/mL (ref <0.03)

## 2016-05-07 LAB — AMMONIA: AMMONIA: 38 umol/L — AB (ref 9–35)

## 2016-05-07 LAB — VITAMIN B12: Vitamin B-12: 2033 pg/mL — ABNORMAL HIGH (ref 180–914)

## 2016-05-07 LAB — I-STAT CG4 LACTIC ACID, ED: LACTIC ACID, VENOUS: 1.58 mmol/L (ref 0.5–1.9)

## 2016-05-07 LAB — CK: Total CK: 211 U/L (ref 49–397)

## 2016-05-07 LAB — ETHANOL: Alcohol, Ethyl (B): <5 mg/dL (ref <5)

## 2016-05-07 LAB — TSH: TSH: 0.467 u[IU]/mL (ref 0.350–4.500)

## 2016-05-07 MED ORDER — CLONIDINE HCL 0.2 MG/24HR TD PTWK: 0.2000 mg | MEDICATED_PATCH | TRANSDERMAL | Status: DC

## 2016-05-07 MED ORDER — INSULIN GLARGINE 100 UNIT/ML ~~LOC~~ SOLN
20.0000 [IU] | Freq: Every day | SUBCUTANEOUS | Status: DC
  Administered 2016-05-07 – 2016-05-09 (×3): 20 [IU] via SUBCUTANEOUS
  Filled 2016-05-07 (×5): qty 0.2

## 2016-05-07 MED ORDER — PREDNISONE 5 MG PO TABS
5.0000 mg | ORAL_TABLET | Freq: Two times a day (BID) | ORAL | Status: DC
  Administered 2016-05-08 – 2016-05-10 (×5): 5 mg via ORAL
  Filled 2016-05-07 (×6): qty 1

## 2016-05-07 MED ORDER — SODIUM CHLORIDE 0.9 % IV SOLN
INTRAVENOUS | Status: DC
  Administered 2016-05-07 – 2016-05-08 (×3): via INTRAVENOUS

## 2016-05-07 MED ORDER — AMLODIPINE BESYLATE 10 MG PO TABS
10.0000 mg | ORAL_TABLET | Freq: Every morning | ORAL | Status: DC
  Administered 2016-05-08 – 2016-05-10 (×3): 10 mg via ORAL
  Filled 2016-05-07 (×3): qty 1

## 2016-05-07 MED ORDER — ACETAMINOPHEN 325 MG PO TABS: 650.0000 mg | ORAL_TABLET | Freq: Four times a day (QID) | ORAL | Status: DC | PRN

## 2016-05-07 MED ORDER — CARBAMAZEPINE ER 200 MG PO TB12
600.0000 mg | ORAL_TABLET | Freq: Two times a day (BID) | ORAL | Status: DC
  Administered 2016-05-08 – 2016-05-10 (×4): 600 mg via ORAL
  Filled 2016-05-07 (×7): qty 3

## 2016-05-07 MED ORDER — ACETAMINOPHEN 650 MG RE SUPP: 650.0000 mg | Freq: Four times a day (QID) | RECTAL | Status: DC | PRN

## 2016-05-07 MED ORDER — LATANOPROST 0.005 % OP SOLN
1.0000 [drp] | Freq: Every day | OPHTHALMIC | Status: DC
  Administered 2016-05-07 – 2016-05-09 (×3): 1 [drp] via OPHTHALMIC
  Filled 2016-05-07: qty 2.5

## 2016-05-07 MED ORDER — ENOXAPARIN SODIUM 30 MG/0.3ML ~~LOC~~ SOLN
30.0000 mg | SUBCUTANEOUS | Status: DC
  Administered 2016-05-07 – 2016-05-08 (×2): 30 mg via SUBCUTANEOUS
  Filled 2016-05-07 (×2): qty 0.3

## 2016-05-07 MED ORDER — METOPROLOL SUCCINATE ER 100 MG PO TB24
200.0000 mg | ORAL_TABLET | Freq: Every morning | ORAL | Status: DC
Start: 2016-05-08 — End: 2016-05-10
  Administered 2016-05-08 – 2016-05-10 (×3): 200 mg via ORAL
  Filled 2016-05-07 (×3): qty 2

## 2016-05-07 MED ORDER — ATORVASTATIN CALCIUM 80 MG PO TABS
80.0000 mg | ORAL_TABLET | Freq: Every day | ORAL | Status: DC
Start: 2016-05-08 — End: 2016-05-10
  Administered 2016-05-08 – 2016-05-10 (×3): 80 mg via ORAL
  Filled 2016-05-07 (×3): qty 1

## 2016-05-07 MED ORDER — HYDRALAZINE HCL 50 MG PO TABS
100.0000 mg | ORAL_TABLET | Freq: Three times a day (TID) | ORAL | Status: DC
  Administered 2016-05-08 – 2016-05-10 (×7): 100 mg via ORAL
  Filled 2016-05-07 (×9): qty 2

## 2016-05-07 MED ORDER — DORZOLAMIDE HCL 2 % OP SOLN
1.0000 [drp] | Freq: Every day | OPHTHALMIC | Status: DC
  Administered 2016-05-07 – 2016-05-10 (×4): 1 [drp] via OPHTHALMIC
  Filled 2016-05-07: qty 10

## 2016-05-07 MED ORDER — DOCUSATE SODIUM 100 MG PO CAPS
100.0000 mg | ORAL_CAPSULE | Freq: Two times a day (BID) | ORAL | Status: DC
  Administered 2016-05-08 – 2016-05-10 (×4): 100 mg via ORAL
  Filled 2016-05-07 (×5): qty 1

## 2016-05-07 MED ORDER — SENNA 8.6 MG PO TABS
1.0000 | ORAL_TABLET | Freq: Every day | ORAL | Status: DC
  Administered 2016-05-08 – 2016-05-10 (×3): 8.6 mg via ORAL
  Filled 2016-05-07 (×3): qty 1

## 2016-05-07 MED ORDER — TIMOLOL MALEATE 0.5 % OP SOLN
1.0000 [drp] | Freq: Every day | OPHTHALMIC | Status: DC
  Administered 2016-05-07 – 2016-05-10 (×4): 1 [drp] via OPHTHALMIC
  Filled 2016-05-07: qty 5

## 2016-05-07 MED ORDER — DORZOLAMIDE HCL-TIMOLOL MAL 2-0.5 % OP SOLN
1.0000 [drp] | Freq: Every day | OPHTHALMIC | Status: DC
  Filled 2016-05-07: qty 10

## 2016-05-07 MED ORDER — INSULIN ASPART 100 UNIT/ML ~~LOC~~ SOLN
0.0000 [IU] | Freq: Three times a day (TID) | SUBCUTANEOUS | Status: DC
Start: 2016-05-08 — End: 2016-05-08

## 2016-05-07 MED ORDER — BRIMONIDINE TARTRATE 0.2 % OP SOLN
1.0000 [drp] | Freq: Three times a day (TID) | OPHTHALMIC | Status: DC
  Administered 2016-05-07 – 2016-05-10 (×9): 1 [drp] via OPHTHALMIC
  Filled 2016-05-07: qty 5

## 2016-05-07 MED ORDER — HYDRALAZINE HCL 20 MG/ML IJ SOLN
10.0000 mg | INTRAMUSCULAR | Status: DC | PRN
  Administered 2016-05-07: 10 mg via INTRAVENOUS
  Filled 2016-05-07: qty 1

## 2016-05-07 MED ORDER — ASPIRIN 81 MG PO CHEW
81.0000 mg | CHEWABLE_TABLET | Freq: Every morning | ORAL | Status: DC
  Administered 2016-05-08 – 2016-05-10 (×3): 81 mg via ORAL
  Filled 2016-05-07 (×3): qty 1

## 2016-05-07 MED ORDER — SODIUM CHLORIDE 0.9 % IV BOLUS (SEPSIS)
1000.0000 mL | Freq: Once | INTRAVENOUS | Status: AC
  Administered 2016-05-07: 1000 mL via INTRAVENOUS

## 2016-05-07 NOTE — ED Triage Notes (Signed)
Pt. Coming from home via [facility] for altered mental status since yesterday at 0900. Pt. Hx of stroke and lives with ex wife and caretaker. EMS reports both are poor historians. Pt. Ex wife reports increased lethargy and difficulty with ambulation since yesterday. Pt. Also hx of epilepsy, but no witnessed seizure activity. Pt. Disoriented with baseline unknown by EMS. Pt. Right-sided def. And speech impairments from previous stroke. Pt. CBG 450. Pt. Also hx of afib.

## 2016-05-07 NOTE — Progress Notes (Signed)
Received report from Erin, RN.

## 2016-05-07 NOTE — ED Notes (Signed)
Patient transported to MRI 

## 2016-05-07 NOTE — Progress Notes (Addendum)
Collin Fox [id number] Admission Data: 05/07/2016 6:27 PM Attending Provider: Lupita Dawn, MD  [id number], [facility], MD Consults/ Treatment Team:   Collin Fox is a 70 y.o. male patient admitted from ED awake and  alert,  Full Code, VSS - Blood pressure (!) 177/78, pulse 61, temperature 98 F (36.7 C), temperature source Oral, resp. rate 16, height 6\' 1"  (1.854 m), weight 80.7 kg (178 lb), SpO2 100 %., O2    , no c/o shortness of breath, no c/o chest pain, no distress noted. Tele # 27 placed and pt is currently running:a fib.   IV site WDL:  external jugular right, condition patent and no redness with a transparent dsg that's clean dry and intact.  Allergies:  No Known Allergies   Past Medical History:  Diagnosis Date  . Atrial fibrillation ([place])   . Coronary artery disease   . Diabetes mellitus without complication ([place])   . Heart attack   . Hypertension   . Stroke [facility])     History:  obtained from spouse. Tobacco/alcohol: denied none  Pt orientation to unit, room and routine. Information packet given to patient/family and safety video watched.  Admission INP armband ID verified with patient/family, and in place. SR up x 2, fall risk assessment complete with Patient and family verbalizing understanding of risks associated with falls. Pt verbalizes an understanding of how to use the call bell and to call for help before getting out of bed.  Skin, clean-dry- intact without evidence of bruising, or skin tears.   No evidence of skin break down noted on exam. no rashes, no ecchymoses, birth mark on back.     Will cont to monitor and assist as needed.

## 2016-05-07 NOTE — ED Notes (Signed)
RN attempted IV 2x unsuccessfully. Phlebotomy also attempted blood draw unsuccessfully. IV team consult at this time.

## 2016-05-07 NOTE — ED Notes (Signed)
This RN had two failed attempted at starting an IV. Lab has attempted to draw blood as well but was only able to get 3 ccs.

## 2016-05-07 NOTE — H&P (Signed)
[facility] Admission History and Physical Service Pager: [phone]  Patient name: Collin Fox Medical record number: [id number] Date of birth: Jun 24, 1945 Age: 70 y.o. Gender: male  Primary Care Provider: Beacher May, MD Consultants: none Code Status: full  Chief Complaint: AMS  Assessment and Plan: Collin Fox is a 70 y.o. male presenting with altered mental status. PMH is significant for HTN, CHF, CKD, hx CVA with residual dysarthria, T2DM, hx MI with CABG, hx prostate cancer.  AMS: began yesterday evening with weakness and increased drowsiness, per family. Patient reported feeling unwell but could not explain in what way. Reported feeling cold. Vital signs stable in ED. Concern for stroke, patient has history of afib and 2 previous strokes, and is not currently anticoagulated. Does not appear to have new weakness on exam but per family is not acting his usual self. Neuro exam is seemingly at baseline, although is difficult to assess. Differential also includes dehydration as patient's creatinine and BUN are increased on admission versus new cardiac event vs infection. Per patient's daughter, patient has had altered mental status exactly like this in the past when he has become dehydrated. Pt taking Lasix and Spironolactone at home. UA with >1000 glucose, so osmotic diuresis is also likely contributing. Infection less likely as patient has no white count (5.1), is afebrile, and lactic acid is normal at 1.58. UA without leukocytes or nitrites. CXR negative. -admit to telemetry, attending Dr. Ree Kida -vital signs per unit -blood cultures pending. Antibiotics not indicated at this time. -UDS pending -MRI brain pending to rule out stroke -am CBC and BMP -will check TSH, HIV, RPR, B12, and folate -neuro checks q4 hours -bedside swallow eval -PT/OT consult -trend troponin, am EKG to rule out cardiac cause - NPO for now, pending bedside swallow - Wife  to get records from [place]  Acute on CKD- creatinine 3.11 on admission, improved to 2.90. noted to be 1.9 last month -s/p 1L bolus in the ED. Continue maintenance fluids at 125/hr. Reassess volume status in the morning. -check BMP in am -watch for fluid overload with diagnosis of CHF  HTN: norvasc 10mg  daily, hydralazine 100mg  TID, losartan 100mg  every day, metoprolol succinate 200mg  every day, spironolactone 25mg  daily, lasix 40mg  daily, clonidine patch q3 days -continue home medications except for Lasix, Losartan, Spironolactone due to AKI and dehydration -monitor blood pressure  Afib- currently in afib, rate controlled, takes daily aspirin. Not on any other anticoagulation. CHADSVASc is a 7. Think he is likely at high risk for falls.  -monitor on telemetry -Continue Aspirin 81mg  daily  Type 2 Diabetes- hyperglycemic in ED. Takes novolin 70/30, 32 units with breakfast, 12 units with dinner, which is a daily total of ~30 units of NPH + ~13 units of regular insulin. -Lantus 20 units daily -moderate SSI -CBG AC/HS  CAD w/ hx of MI and CABG- MI in 1994. daily aspirin 81mg  and lipitor 80mg  daily. Troponin in ED 0.04. No active chest pain. EKG without acute changes. 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 Per wife, weakness and dysarthria worse than usual today, patient reports more weakness.  -q4 neuro checks -work up as above -PT/OT consult  Prostate cancer- diagnosed 3-4 years ago, leuprolide injections every 6 months, recently w/ reaction to medication and on 5mg  prednisone BID -continue prednisone -next leuprolide injection May 2018  Constipation -continue home colace and senakot  Hx of Gout- Has  colchicine as needed  -monitor, no active flare  FEN/GI: NPO pending nurse swallow eval Prophylaxis: lovenox  Disposition: admit to telemetry  History of Present Illness:  Collin Fox is a 70 y.o. male presenting with altered mental status. His wife went to check on him last night and noticed that the house was very hot. He stated the heat was high because he was cold. His wife noticed that he wasn't really making sense. The wife's neighbor noticed that "he didn't look quite right". He was a little drowsy. He was just saying that he didn't feel good, but couldn't explain how. He was eating and drinking like normal yesterday. Today, his nurse came out to the house and asked the wife to come to the house. His wife noticed that he couldn't answer questions. He was more drowsy and wouldn't talk. He also couldn't walk. He couldn't get up from a chair and was "stumbling". His wife feels like he's weak. She was trying to get him to move his legs, but he wouldn't move his legs. He was also slurring his words more than normal.   He has dysarthria at baseline. There is a home health nurse that comes out to the house. He lives alone. His wife lives ~20 minutes from him. Daughter states that the family has concerns about his ability to care for himself at home and think SNF placement would be a good idea.  In the ED, vitals were stable. Cr 3.11 > 2.90. WBC 5.1. CK 211. VBG with pH 7.482, pCO2 34.9, pO2 87. Trop 0.04. Ethyl alcohol <5. UA with >1000 glucose. Blood cultures were drawn. CXR was negative. She was given a 1L NS bolus.  Review Of Systems: Per HPI with the following additions: runny nose, no vomiting, no diarrhea.  Review of Systems  HENT: Negative for sore throat.   Eyes: Negative for blurred vision and double vision.  Respiratory: Negative for cough and shortness of breath.   Cardiovascular: Negative for chest pain and leg swelling.  Gastrointestinal: Negative for abdominal pain, diarrhea, nausea and vomiting.  Genitourinary: Positive for frequency. Negative for dysuria.  Musculoskeletal: Negative for falls.  Skin: Negative for rash.  Neurological: Positive for speech  change and weakness. Negative for headaches.  Psychiatric/Behavioral: Negative for substance abuse.    Patient Active Problem List   Diagnosis Date Noted  . Encephalopathy 05/07/2016  . Stroke ([place]) 11/13/2014  . Hypertension 05/24/2012  . CAP (community acquired pneumonia) 05/23/2012  . CHF (congestive heart failure) ([place]) 05/23/2012  . Renal insufficiency 05/23/2012  . Sepsis ([place]) 05/23/2012    Past Medical History: Past Medical History:  Diagnosis Date  . Atrial fibrillation ([place])   . Coronary artery disease   . Diabetes mellitus without complication ([place])   . Heart attack   . Hypertension   . Stroke [facility])     Past Surgical History: Past Surgical History:  Procedure Laterality Date  . CORONARY ARTERY BYPASS GRAFT    . EYE SURGERY    . PROSTATE SURGERY      Social History: Social History  Substance Use Topics  . Smoking status: Never Smoker  . Smokeless tobacco: Never Used  . Alcohol use No   Additional social history: lives alone but has CNA, wife (lives separately), and daughter check in on him daily Please also refer to relevant sections of EMR.  Family History: Family History  Problem Relation Age of Onset  . Diabetes Brother   . Heart disease Brother   .  Hypertension Brother    Allergies and Medications: No Known Allergies No current facility-administered medications on file prior to encounter.    Current Outpatient Prescriptions on File Prior to Encounter  Medication Sig Dispense Refill  . amLODipine (NORVASC) 10 MG tablet Take 10 mg by mouth every morning.    Marland Kitchen ammonium lactate (LAC-HYDRIN) 12 % lotion Apply 1 application topically daily.    Marland Kitchen aspirin 81 MG chewable tablet Chew 81 mg by mouth every morning.    Marland Kitchen atorvastatin (LIPITOR) 80 MG tablet Take 80 mg by mouth daily at 6 PM.    . brimonidine (ALPHAGAN) 0.2 % ophthalmic solution Place 1 drop into both eyes 3 (three) times daily.    . carbamazepine (CARBATROL) 300 MG 12 hr capsule Take 600  mg by mouth 2 (two) times daily.     . cloNIDine (CATAPRES - DOSED IN MG/24 HR) 0.2 mg/24hr patch Place 1 patch onto the skin every 3 (three) days.     . colchicine 0.6 MG tablet Take 2 tabs po x 1 now, then 1 tab an hour later. Even if gout re-flairs, do not repeat for a week. (Patient taking differently: Take 0.6 mg by mouth daily as needed (gout flare ups). ) 30 tablet 0  . Diclofenac Sodium 1 % CREA Place 1 application onto the skin 4 (four) times daily as needed. To right large toe (Patient taking differently: Place 1 application onto the skin 4 (four) times daily as needed (pain). To right large toe) 120 g 0  . docusate sodium (COLACE) 100 MG capsule Take 100 mg by mouth 2 (two) times daily. For constipation    . dorzolamide-timolol (COSOPT) 22.3-6.8 MG/ML ophthalmic solution Place 1 drop into both eyes daily.    . furosemide (LASIX) 40 MG tablet Take 20 mg by mouth daily.     . hydrALAZINE (APRESOLINE) 100 MG tablet Take 100 mg by mouth 3 (three) times daily.    . insulin NPH-insulin regular (NOVOLIN 70/30) (70-30) 100 UNIT/ML injection Inject 12-33 Units into the skin 2 (two) times daily with a meal. Inject 32 units with breakfast and 12 units with supper    . latanoprost (XALATAN) 0.005 % ophthalmic solution Place 1 drop into both eyes at bedtime.    Marland Kitchen leuprolide, 6 Month, (ELIGARD) 45 MG injection Inject 45 mg into the skin every 6 (six) months.    Marland Kitchen losartan (COZAAR) 100 MG tablet Take 100 mg by mouth every morning.    . metoprolol (TOPROL-XL) 200 MG 24 hr tablet Take 200 mg by mouth every morning.    . senna (SENOKOT) 8.6 MG TABS Take 1 tablet by mouth daily. For constipation     . spironolactone (ALDACTONE) 25 MG tablet Take 50 mg by mouth 2 (two) times daily.      Objective: BP 158/79   Pulse 66   Temp 98.3 F (36.8 C) (Oral)   Resp 21   Ht 6\' 1"  (1.854 m)   Wt 225 lb (102.1 kg)   SpO2 100%   BMI 29.69 kg/m  Exam: General: elderly, frail appearing gentleman, laying in bed  in NAD Eyes: PERRL, arcus senilis present bilaterally ENTM: dry mucous membranes, oropharynx clear Neck: supple, non-tender. IV in place on right side Cardiovascular: irregular rhythm, regular rate, no MRG Respiratory: CTA bilaterally, no increased work of breathing Gastrointestinal: soft, non-tender, non-distended, +BS MSK: trace edema noted on right lower extremity versus left, no swelling or cyanosis noted Derm: skin warm, dry, no lesions noted. Large  red discolored patch noted over most of back Neuro: alert to person and place. Dysarthria, difficult to understand. Difficult time following commands. CN 2-12 grossly intact. Strength 5/5 in left arm, 4/5 in right arm. 4/5 in left leg, 3/5 in right leg. Hyperreflexia noted on left.   Labs and Imaging: CBC BMET   Recent Labs Lab 05/07/16 1211 05/07/16 1340  WBC 5.1  --   HGB 12.2* 13.6  HCT 37.2* 40.0  PLT 177  --     Recent Labs Lab 05/07/16 1211 05/07/16 1340  NA 136 138  K 4.1 4.1  CL 104 104  CO2 22  --   BUN 80* 70*  CREATININE 3.11* 2.90*  GLUCOSE 382* 390*  CALCIUM 10.3  --      Ct Head Wo Contrast  Result Date: 05/07/2016 CLINICAL DATA:  Altered mental status since yesterday EXAM: CT HEAD WITHOUT CONTRAST TECHNIQUE: Contiguous axial images were obtained from the base of the skull through the vertex without intravenous contrast. COMPARISON:  01/21/2016 FINDINGS: Brain: There is extensive encephalomalacia of the left parietal, anterior temporal, and posterior frontal lobes. This is a stable finding. There is also encephalomalacia in the medial right frontal lobe. There is no mass effect. There is no acute intracranial hemorrhage. Chronic ischemic changes in the periventricular white matter are superimposed. Vascular: No hyperdense vessel or unexpected calcification. Skull: Intact. Sinuses/Orbits: Mastoid air cells are clear. Visualized paranasal sinuses are clear. Prosthetic device is associated with the right globe. This  is unchanged in position. Other: None. IMPRESSION: Chronic ischemic changes.  No acute intracranial pathology. Electronically Signed   By: Marybelle Killings M.D.   On: 05/07/2016 13:01   Dg Chest Port 1 View  Result Date: 05/07/2016 CLINICAL DATA:  Altered mental status beginning yesterday. Lethargy and difficulty walking. EXAM: PORTABLE CHEST 1 VIEW COMPARISON:  01/21/2016 FINDINGS: Previous median sternotomy and CABG. Enlarged cardiac silhouette. Aortic atherosclerosis. The lungs are clear. The vascularity is normal. No effusions. No acute bone finding. IMPRESSION: No active disease. Previous CABG. Cardiomegaly. Aortic atherosclerosis. Electronically Signed   By: Nelson Chimes M.D.   On: 05/07/2016 12:45     Steve Rattler, DO 05/07/2016, 4:02 PM PGY-1, [place] Intern pager: [phone], text pages welcome  FPTS Upper-Level Resident Addendum  I have independently interviewed and examined the patient. I have discussed the above with the original author and agree with their documentation. My edits for correction/addition/clarification are in blue. Please see also any attending notes.   Hyman Bible, MD PGY-2, [place] Service pager: [phone] (text pages welcome through [place])

## 2016-05-07 NOTE — ED Provider Notes (Signed)
[facility] DEPT Provider Note   CSN: [id number] Arrival date & time: 05/07/16  1144     History   Chief Complaint Chief Complaint  Patient presents with  . Altered Mental Status    HPI Level V caveat due to altered mental status and expressive aphasia Collin Fox is a 70 y.o. male with past medical history of stroke with right-sided hemiparesis and Expressive and receptive aphasia. He has had a past history of coronary artery disease, previous MI, hypertension, stroke, insulin-dependent diabetes, CHF, renal insufficiency and is currently under treatment for prostate cancer. History is gathered from the patient's ex-wife and daughter. He is taken care of by his family and a CNA. Although he was alone. He has near 24-hour care. According to the patient's daughter. He is doing okay Saturday, which was his birthday. Yesterday, Sunday, he was complaining of not feeling well in the morning and didn't want to go to church. That evening he called his ex-wife on the phone asking for help. When she arrived at the house with her daughter. The patient had the heat set to 90, complaining that he was very cold. He had not taken any of his medications including his insulin. His blood sugar was very high. They settled him down and give him medications. This morning the CNA arrived to help him get dressed and start his day. When she got there. She states that he seemed confused and was not his normal self. His daughter states that he has had multiple visits for the same kind of altered mental status complaints. She states that sometimes he "puts on for attention." She states that he also has these problems when he gets very dehydrated.  HPI  Past Medical History:  Diagnosis Date  . Atrial fibrillation ([place])   . Coronary artery disease   . Diabetes mellitus without complication ([place])   . Heart attack   . Hypertension   . Stroke [facility])     Patient Active Problem List   Diagnosis Date Noted  .  Stroke ([place]) 11/13/2014  . Hypertension 05/24/2012  . CAP (community acquired pneumonia) 05/23/2012  . CHF (congestive heart failure) ([place]) 05/23/2012  . Renal insufficiency 05/23/2012  . Sepsis ([place]) 05/23/2012    Past Surgical History:  Procedure Laterality Date  . CORONARY ARTERY BYPASS GRAFT    . EYE SURGERY    . PROSTATE SURGERY         Home Medications    Prior to Admission medications   Medication Sig Start Date End Date Taking? Authorizing Provider  amLODipine (NORVASC) 10 MG tablet Take 10 mg by mouth every morning.    Historical Provider, MD  ammonium lactate (LAC-HYDRIN) 12 % lotion Apply 1 application topically daily.    Historical Provider, MD  aspirin 81 MG chewable tablet Chew 81 mg by mouth every morning.    Historical Provider, MD  atorvastatin (LIPITOR) 80 MG tablet Take 80 mg by mouth daily at 6 PM.    Historical Provider, MD  brimonidine (ALPHAGAN) 0.2 % ophthalmic solution Place 1 drop into both eyes 3 (three) times daily.    Historical Provider, MD  carbamazepine (CARBATROL) 300 MG 12 hr capsule Take 600 mg by mouth 2 (two) times daily.     Historical Provider, MD  cloNIDine (CATAPRES - DOSED IN MG/24 HR) 0.2 mg/24hr patch Place 1 patch onto the skin every 3 (three) days.     Historical Provider, MD  colchicine 0.6 MG tablet Take 2 tabs po x 1  now, then 1 tab an hour later. Even if gout re-flairs, do not repeat for a week. Patient taking differently: Take 0.6 mg by mouth daily as needed (gout flare ups).  08/06/15   Shawnee Knapp, MD  Diclofenac Sodium 1 % CREA Place 1 application onto the skin 4 (four) times daily as needed. To right large toe Patient taking differently: Place 1 application onto the skin 4 (four) times daily as needed (pain). To right large toe 08/06/15   Shawnee Knapp, MD  docusate sodium (COLACE) 100 MG capsule Take 100 mg by mouth 2 (two) times daily. For constipation    Historical Provider, MD  dorzolamide-timolol (COSOPT) 22.3-6.8 MG/ML  ophthalmic solution Place 1 drop into both eyes daily.    Historical Provider, MD  furosemide (LASIX) 40 MG tablet Take 40 mg by mouth as directed. Take 40mg  every morning, and then takes 40mg  in the afternoon every other day    Historical Provider, MD  hydrALAZINE (APRESOLINE) 100 MG tablet Take 100 mg by mouth 3 (three) times daily.    Historical Provider, MD  insulin NPH-insulin regular (NOVOLIN 70/30) (70-30) 100 UNIT/ML injection Inject 12-33 Units into the skin 2 (two) times daily with a meal. Inject 32 units with breakfast and 12 units with supper    Historical Provider, MD  latanoprost (XALATAN) 0.005 % ophthalmic solution Place 1 drop into both eyes at bedtime.    Historical Provider, MD  leuprolide, 6 Month, (ELIGARD) 45 MG injection Inject 45 mg into the skin every 6 (six) months.    Historical Provider, MD  losartan (COZAAR) 100 MG tablet Take 100 mg by mouth every morning.    Historical Provider, MD  metoprolol (TOPROL-XL) 200 MG 24 hr tablet Take 200 mg by mouth every morning.    Historical Provider, MD  senna (SENOKOT) 8.6 MG TABS Take 1 tablet by mouth daily. For constipation     Historical Provider, MD  spironolactone (ALDACTONE) 25 MG tablet Take 50 mg by mouth 2 (two) times daily.    Historical Provider, MD    Family History Family History  Problem Relation Age of Onset  . Diabetes Brother   . Heart disease Brother   . Hypertension Brother     Social History Social History  Substance Use Topics  . Smoking status: Never Smoker  . Smokeless tobacco: Never Used  . Alcohol use No     Allergies   Patient has no known allergies.   Review of Systems Review of Systems  Review of systems Physical Exam Updated Vital Signs Ht 6\' 1"  (1.854 m)   Wt 102.1 kg   SpO2 99%   BMI 29.69 kg/m   Physical Exam  Constitutional:  Patient appears ill Patient appears lethargic, is responsive to pain and intermittently responds to voice and questioning  HENT:  Head:  Normocephalic.   Dry mucous membranes  Eyes: EOM are normal. Pupils are equal, round, and reactive to light.  Arcus senilis present  Neck: Normal range of motion. Neck supple.  Cardiovascular: Normal rate and regular rhythm.   Pulmonary/Chest: Effort normal and breath sounds normal.  Abdominal: Soft. He exhibits no distension. There is no tenderness.  Neurological:  Unable to assess orientation Patient is alert but only intermittently responds to commands. He always responds to pain stimulus. The patient does not follow commands well.  Skin: Capillary refill takes less than 2 seconds.  Nursing note and vitals reviewed.    ED Treatments / Results  Labs (all labs  ordered are listed, but only abnormal results are displayed) Labs Reviewed  CBG MONITORING, ED - Abnormal; Notable for the following:       Result Value   Glucose-Capillary 362 (*)    All other components within normal limits    EKG  EKG Interpretation  Date/Time:  Tuesday May 07 2016 12:20:05 EST Ventricular Rate:  65 PR Interval:    QRS Duration: 102 QT Interval:  406 QTC Calculation: 423 R Axis:   62 Text Interpretation:  Atrial fibrillation Probable LVH with secondary repol abnrm No acute changes No significant change since last tracing Confirmed by Kathrynn Humble, MD, [facility] Comp [phone]) on 05/07/2016 12:53:32 PM       Radiology No results found.  Procedures Procedures (including critical care time)  Medications Ordered in ED Medications - No data to display   Initial Impression / Assessment and Plan / ED Course  I have reviewed the triage vital signs and the nursing notes.  Pertinent labs & imaging results that were available during my care of the patient were reviewed by me and considered in my medical decision making (see chart for details).  Clinical Course as of May 07 1542  Tue May 07, 2016  1441 Troponin I: (!!) 0.04 [AH]    Clinical Course User Index [AH] Margarita Mail, PA-C    Patient  with acute kidney injury. Troponin is elevated. Chest above the level of normal. This may be secondary to his acute kidney injury. I don't think that he is having acute MI at this time. An EKG is without changes. He has a history of chronic A. fib. He is rate controlled at this time. Patient has not gotten fluids at this time after multiple attempts- we will try for an ej. Dr. Tammy Sours reccomends a nMRI to r/o pontine stroke.  Final Clinical Impressions(s) / ED Diagnoses   Final diagnoses:  Encephalopathy  AKI (acute kidney injury) [facility])    New Prescriptions New Prescriptions   No medications on file     Margarita Mail, PA-C 05/07/16 [place], MD 05/08/16 1937

## 2016-05-07 NOTE — ED Notes (Signed)
Spoke with IV team. IV team attempted twice to get an IV but was unsuccessful. IV team is recommending an EJ or Central line if pt is going to be admitted.

## 2016-05-07 NOTE — ED Notes (Signed)
Attempted report 2x

## 2016-05-07 NOTE — ED Notes (Addendum)
IV team unable to establish IV access. She is recommending EDP do EJ for access.   EDP made aware.

## 2016-05-08 LAB — OCCULT BLOOD X 1 CARD TO LAB, STOOL: FECAL OCCULT BLD: NEGATIVE

## 2016-05-08 LAB — CBC
HCT: 36 % — ABNORMAL LOW (ref 39.0–52.0)
Hemoglobin: 11.9 g/dL — ABNORMAL LOW (ref 13.0–17.0)
MCH: 28.7 pg (ref 26.0–34.0)
MCHC: 33.1 g/dL (ref 30.0–36.0)
MCV: 87 fL (ref 78.0–100.0)
PLATELETS: 170 10*3/uL (ref 150–400)
RBC: 4.14 MIL/uL — AB (ref 4.22–5.81)
RDW: 13.7 % (ref 11.5–15.5)
WBC: 5.3 10*3/uL (ref 4.0–10.5)

## 2016-05-08 LAB — BASIC METABOLIC PANEL
Anion gap: 9 (ref 5–15)
BUN: 62 mg/dL — AB (ref 6–20)
CHLORIDE: 108 mmol/L (ref 101–111)
CO2: 24 mmol/L (ref 22–32)
CREATININE: 2.47 mg/dL — AB (ref 0.61–1.24)
Calcium: 9.8 mg/dL (ref 8.9–10.3)
GFR calc Af Amer: 29 mL/min — ABNORMAL LOW (ref >60)
GFR calc non Af Amer: 25 mL/min — ABNORMAL LOW (ref >60)
GLUCOSE: 175 mg/dL — AB (ref 65–99)
Potassium: 3.5 mmol/L (ref 3.5–5.1)
Sodium: 141 mmol/L (ref 135–145)

## 2016-05-08 LAB — FOLATE RBC
Folate, Hemolysate: >620 ng/mL
Folate, RBC: >1554 ng/mL (ref >498)
Hematocrit: 39.9 % (ref 37.5–51.0)

## 2016-05-08 LAB — GLUCOSE, CAPILLARY
GLUCOSE-CAPILLARY: 152 mg/dL — AB (ref 65–99)
GLUCOSE-CAPILLARY: 377 mg/dL — AB (ref 65–99)
Glucose-Capillary: 113 mg/dL — ABNORMAL HIGH (ref 65–99)
Glucose-Capillary: 116 mg/dL — ABNORMAL HIGH (ref 65–99)
Glucose-Capillary: 356 mg/dL — ABNORMAL HIGH (ref 65–99)

## 2016-05-08 LAB — RPR: RPR Ser Ql: NONREACTIVE

## 2016-05-08 LAB — TROPONIN I
TROPONIN I: 0.05 ng/mL — AB (ref <0.03)
Troponin I: 0.06 ng/mL (ref <0.03)

## 2016-05-08 LAB — HIV ANTIBODY (ROUTINE TESTING W REFLEX): HIV SCREEN 4TH GENERATION: NONREACTIVE

## 2016-05-08 MED ORDER — INSULIN ASPART 100 UNIT/ML ~~LOC~~ SOLN
0.0000 [IU] | Freq: Three times a day (TID) | SUBCUTANEOUS | Status: DC
  Administered 2016-05-08: 20 [IU] via SUBCUTANEOUS
  Administered 2016-05-09: 4 [IU] via SUBCUTANEOUS
  Administered 2016-05-09: 20 [IU] via SUBCUTANEOUS
  Administered 2016-05-09: 11 [IU] via SUBCUTANEOUS
  Administered 2016-05-10 (×3): 7 [IU] via SUBCUTANEOUS

## 2016-05-08 MED ORDER — CHLORHEXIDINE GLUCONATE 0.12 % MT SOLN
15.0000 mL | Freq: Two times a day (BID) | OROMUCOSAL | Status: DC
  Administered 2016-05-08 – 2016-05-10 (×5): 15 mL via OROMUCOSAL
  Filled 2016-05-08 (×5): qty 15

## 2016-05-08 MED ORDER — GLUCERNA SHAKE PO LIQD
237.0000 mL | Freq: Three times a day (TID) | ORAL | Status: DC
  Administered 2016-05-08 – 2016-05-10 (×6): 237 mL via ORAL

## 2016-05-08 MED ORDER — ORAL CARE MOUTH RINSE
15.0000 mL | Freq: Two times a day (BID) | OROMUCOSAL | Status: DC
  Administered 2016-05-08 – 2016-05-10 (×6): 15 mL via OROMUCOSAL

## 2016-05-08 MED ORDER — INSULIN ASPART 100 UNIT/ML ~~LOC~~ SOLN: 0.0000 [IU] | SUBCUTANEOUS | Status: DC

## 2016-05-08 NOTE — Evaluation (Signed)
Clinical/Bedside Swallow Evaluation Patient Details  Name: Collin Fox MRN: [id number] Date of Birth: 1946-03-29  Today's Date: 05/08/2016 Time: SLP Start Time (ACUTE ONLY): 1010 SLP Stop Time (ACUTE ONLY): 1021 SLP Time Calculation (min) (ACUTE ONLY): 11 min  Past Medical History:  Past Medical History:  Diagnosis Date  . Anemia   . Anxiety   . Atrial fibrillation ([place])   . CHF (congestive heart failure) ([place])   . Chronic kidney disease    "don't know what stage" (05/07/2016)  . Coronary artery disease   . Heart attack ~ 1994/1995  . High cholesterol   . Hypertension   . Prostate cancer ([place])   . Seizure-like activity ([place])    "right hand; takes seizure RX" (05/07/2016)  . Stroke [facility]) ~ 1994/1995   "took his speech; right side weaker since; cognitive skills impaired" (05/07/2016)  . Type II diabetes mellitus ([place])    Past Surgical History:  Past Surgical History:  Procedure Laterality Date  . CARDIAC CATHETERIZATION    . CATARACT EXTRACTION, BILATERAL Bilateral   . CORONARY ARTERY BYPASS GRAFT  ~ 1994/1995  . PROSTATE BIOPSY     HPI:  Collin Fox is a 70 y.o. male presenting with altered mental status. PMH is significant for HTN, CHF, CKD, hx CVA with residual dysarthria, T2DM, hx MI with CABG, hx prostate cancer. MRI negative.    Assessment / Plan / Recommendation Clinical Impression  Pt demonstrates normal swallow function. No signs of aspiration observed with 3 oz water test x3. Recommend regular diet and thin liquids. No f/u needed will sign off.     Aspiration Risk  Mild aspiration risk    Diet Recommendation Regular;Thin liquid   Medication Administration: Whole meds with liquid Supervision: Staff to assist with self feeding Compensations: Slow rate;Small sips/bites Postural Changes: Seated upright at 90 degrees    Other  Recommendations Oral Care Recommendations: Oral care BID   Follow up Recommendations None      Frequency and Duration             Prognosis        Swallow Study   General HPI: Collin Fox is a 70 y.o. male presenting with altered mental status. PMH is significant for HTN, CHF, CKD, hx CVA with residual dysarthria, T2DM, hx MI with CABG, hx prostate cancer. MRI negative.  Type of Study: Bedside Swallow Evaluation Previous Swallow Assessment: none in chart, no family to given history, pt aphasia Diet Prior to this Study: NPO Temperature Spikes Noted: No Respiratory Status: Room air History of Recent Intubation: No Behavior/Cognition: Alert;Cooperative;Pleasant mood Oral Cavity Assessment: Within Functional Limits Oral Care Completed by SLP: No Oral Cavity - Dentition: Adequate natural dentition Vision: Functional for self-feeding Self-Feeding Abilities: Needs assist Patient Positioning: Upright in bed Baseline Vocal Quality: Normal Volitional Cough: Strong Volitional Swallow: Able to elicit    Oral/Motor/Sensory Function Overall Oral Motor/Sensory Function: Within functional limits   Ice Chips     Thin Liquid Thin Liquid: Within functional limits Presentation: Cup;Straw    Nectar Thick Nectar Thick Liquid: Not tested   Honey Thick Honey Thick Liquid: Not tested   Puree Puree: Within functional limits   Solid   GO   Solid: Within functional limits       Collin [place], MA CCC-SLP [id number]  Collin Fox, Katherene Ponto 05/08/2016,10:27 AM

## 2016-05-08 NOTE — Progress Notes (Signed)
Initial Nutrition Assessment  DOCUMENTATION CODES:   Not applicable  INTERVENTION:   -Glucerna Shake po TID, each supplement provides 220 kcal and 10 grams of protein  NUTRITION DIAGNOSIS:   Predicted suboptimal nutrient intake related to  (unintentional wt loss) as evidenced by  (20.8% wt loss x 3 months).  GOAL:   Patient will meet greater than or equal to 90% of their needs  MONITOR:   PO intake, Supplement acceptance, Labs, Weight trends, Skin, I & O's  REASON FOR ASSESSMENT:   Malnutrition Screening Tool    ASSESSMENT:   Collin Fox is a 70 y.o. male presenting with altered mental status. PMH is significant for HTN, CHF, CKD, hx CVA with residual dysarthria, T2DM, hx MI with CABG, hx prostate cancer.  Pt admitted with AMS.   Attempted to examine pt x 2, however, working with therapy at time of visit. Pt was being assisted to chair and brushing teeth.   Pt underwent BSE this AM and was advanced to a heart healthy diet. PTA, pt had a home health nurse who assisted with meal preparation.   Reviewed wt hx; question accuracy of wts related to large discrepancy of weight from this admission (225# to 178#). Per wt hx, pt has experienced a 20.8% wt loss over the past 3 months, which is significant for time frame. Wt likely historically fluctuates related hx of CHF and CKD. Pt would benefit from addition of nutrition supplements. RD to order.   CSW following; plan to d/c to SNF once medically stable.   Labs reviewed: CBGS: 113-152.  Diet Order:  Diet Heart Room service appropriate? Yes; Fluid consistency: Thin  Skin:  Reviewed, no issues  Last BM:  05/06/16  Height:   Ht Readings from Last 1 Encounters:  05/07/16 6\' 1"  (1.854 m)    Weight:   Wt Readings from Last 1 Encounters:  05/07/16 178 lb (80.7 kg)    Ideal Body Weight:  83.6 kg  BMI:  Body mass index is 23.48 kg/m.  Estimated Nutritional Needs:   Kcal:  1800-2000  Protein:  85-100  grams  Fluid:  1.8-2.0 L  EDUCATION NEEDS:   No education needs identified at this time  Collin Fox A. Jimmye Norman, RD, LDN, CDE Pager: [phone] After hours Pager: [phone]

## 2016-05-08 NOTE — NC FL2 (Signed)
[place] LEVEL OF CARE SCREENING TOOL     IDENTIFICATION  Patient Name: Collin Fox Birthdate: 07/29/1945 Sex: male Admission Date (Current Location): 05/07/2016  [facility] and [place] Number:  Herbalist and Address:  [facility]. [facility] [place] [address] [place], [place] [address]      Provider Number: [id number]  Attending Physician Name and Address:  Lupita Dawn, MD  Relative Name and Phone Number:       Current Level of Care: Hospital Recommended Level of Care: [place] Prior Approval Number:    Date Approved/Denied:   PASRR Number: [id number]  Discharge Plan: SNF    Current Diagnoses: Patient Active Problem List   Diagnosis Date Noted  . Encephalopathy 05/07/2016  . Altered mental state 05/07/2016  . AKI (acute kidney injury) ([place])   . Stage 3 chronic kidney disease   . Paroxysmal atrial fibrillation (H[facility])   . Coronary artery disease involving native coronary artery of native heart without angina pectoris   . Stroke ([place]) 11/13/2014  . Essential hypertension 05/24/2012  . CAP (community acquired pneumonia) 05/23/2012  . Chronic congestive heart failure ([place]) 05/23/2012  . Renal insufficiency 05/23/2012  . Sepsis ([place]) 05/23/2012    Orientation RESPIRATION BLADDER Height & Weight     Self, Place  Normal External catheter Weight: 178 lb (80.7 kg) Height:  6\' 1"  (185.4 cm)  BEHAVIORAL SYMPTOMS/MOOD NEUROLOGICAL BOWEL NUTRITION STATUS      Continent Diet  AMBULATORY STATUS COMMUNICATION OF NEEDS Skin   Extensive Assist Verbally Normal                       Personal Care Assistance Level of Assistance  Bathing, Dressing Bathing Assistance: Limited assistance   Dressing Assistance: Limited assistance     Functional Limitations Info             SPECIAL CARE FACTORS FREQUENCY  PT (By licensed PT), OT (By licensed OT)     PT Frequency: 5/wk OT Frequency: 5/wk             Contractures      Additional Factors Info  Code Status, Allergies Code Status Info: FULL Allergies Info: NKA           Current Medications (05/08/2016):  This is the current hospital active medication list Current Facility-Administered Medications  Medication Dose Route Frequency Provider Last Rate Last Dose  . 0.9 %  sodium chloride infusion   Intravenous Continuous Steve Rattler, DO 125 mL/hr at 05/08/16 1229    . acetaminophen (TYLENOL) tablet 650 mg  650 mg Oral Q6H PRN Steve Rattler, DO       Or  . acetaminophen (TYLENOL) suppository 650 mg  650 mg Rectal Q6H PRN Steve Rattler, DO      . amLODipine (NORVASC) tablet 10 mg  10 mg Oral q morning - 10a Angela C Riccio, DO   10 mg at 05/08/16 1306  . aspirin chewable tablet 81 mg  81 mg Oral q morning - 10a Angela C Riccio, DO   81 mg at 05/08/16 1306  . atorvastatin (LIPITOR) tablet 80 mg  80 mg Oral q1800 Angela C Riccio, DO      . brimonidine (ALPHAGAN) 0.2 % ophthalmic solution 1 drop  1 drop Both Eyes TID Steve Rattler, DO   1 drop at 05/08/16 1054  . carbamazepine (TEGRETOL XR) 12 hr tablet 600 mg  600 mg  Oral BID Steve Rattler, DO      . chlorhexidine (PERIDEX) 0.12 % solution 15 mL  15 mL Mouth Rinse BID Lupita Dawn, MD   15 mL at 05/08/16 1301  . [START ON 05/11/2016] cloNIDine (CATAPRES - Dosed in mg/24 hr) patch 0.2 mg  0.2 mg Transdermal Weekly Gardiner Rhyme Riccio, DO      . docusate sodium (COLACE) capsule 100 mg  100 mg Oral BID Angela C Riccio, DO      . dorzolamide (TRUSOPT) 2 % ophthalmic solution 1 drop  1 drop Both Eyes Daily Lupita Dawn, MD   1 drop at 05/08/16 1054   And  . timolol (TIMOPTIC) 0.5 % ophthalmic solution 1 drop  1 drop Both Eyes Daily Lupita Dawn, MD   1 drop at 05/08/16 1054  . enoxaparin (LOVENOX) injection 30 mg  30 mg Subcutaneous Q24H Steve Rattler, DO   30 mg at 05/07/16 2100  . hydrALAZINE (APRESOLINE) injection 10 mg  10 mg Intravenous Q4H PRN Verner Mould, MD    10 mg at 05/07/16 2206  . hydrALAZINE (APRESOLINE) tablet 100 mg  100 mg Oral TID Steve Rattler, DO      . insulin aspart (novoLOG) injection 0-20 Units  0-20 Units Subcutaneous TID WC Angela C Riccio, DO      . insulin glargine (LANTUS) injection 20 Units  20 Units Subcutaneous QHS Sela Hua, MD   20 Units at 05/07/16 2206  . latanoprost (XALATAN) 0.005 % ophthalmic solution 1 drop  1 drop Both Eyes QHS Steve Rattler, DO   1 drop at 05/07/16 2102  . MEDLINE mouth rinse  15 mL Mouth Rinse q12n4p Lupita Dawn, MD   15 mL at 05/08/16 1415  . metoprolol succinate (TOPROL-XL) 24 hr tablet 200 mg  200 mg Oral q morning - 10a Angela C Riccio, DO   200 mg at 05/08/16 1306  . predniSONE (DELTASONE) tablet 5 mg  5 mg Oral BID WC Steve Rattler, DO      . senna (SENOKOT) tablet 8.6 mg  1 tablet Oral Daily Steve Rattler, DO   8.6 mg at 05/08/16 1306     Discharge Medications: Please see discharge summary for a list of discharge medications.  Relevant Imaging Results:  Relevant Lab Results:   Additional Information SS#: [id number]  Jorge Ny, LCSW

## 2016-05-08 NOTE — Care Management Note (Signed)
Case Management Note  Patient Details  Name: Collin Fox MRN: [id number] Date of Birth: 01-21-1946  Subjective/Objective:                 Patient admitted from home with wife, active with [facility] RN/ HHA, has walker cane and shower chair. Patient admitted with acute encephalopathy, AKI. Family anticipates need for SNF at discharge. PT eval pending.   Action/Plan:  CM will continue to follow.   Expected Discharge Date:                  Expected Discharge Plan:  [place]  In-House Referral:     Discharge planning Services  CM Consult  Post Acute Care Choice:    Choice offered to:     DME Arranged:    DME Agency:     HH Arranged:    [place] Agency:     Status of Service:  In process, will continue to follow  If discussed at Long Length of Stay Meetings, dates discussed:    Additional Comments:  Carles Collet, RN 05/08/2016, 10:48 AM

## 2016-05-08 NOTE — Progress Notes (Signed)
Family Medicine Teaching Service Daily Progress Note Intern Pager: [phone]  Patient name: Collin Fox Medical record number: [id number] Date of birth: Jan 02, 1946 Age: 70 y.o. Gender: male  Primary Care Provider: [facility] Consultants: none Code Status: full  Pt Overview and Major Events to Date:  11/28- Admitted to telemetry  Assessment and Plan: Collin Fox is a 70 y.o. male presenting with altered mental status. PMH is significant for HTN, CHF, CKD, hx CVA with residual dysarthria, T2DM, hx MI with CABG, hx prostate cancer.  AMS: began yesterday evening with weakness and increased drowsiness, per family. Vital signs stable in ED. Concern for stroke, patient has history of afib and 2 previous strokes, and is not currently anticoagulated. Does not appear to have new weakness on exam but per family is not acting his usual self. Neuro exam is seemingly at baseline, although is difficult to assess. Differential also includes dehydration as patient's creatinine and BUN are increased on admission versus new cardiac event vs infection. Per patient's daughter, patient has had altered mental status exactly like this in the past when he has become dehydrated. Pt taking Lasix and Spironolactone at home. UA with >1000 glucose, so osmotic diuresis is also likely contributing. Infection less likely as patient has no white count (5.1), is afebrile, and lactic acid is normal at 1.58. UA without leukocytes or nitrites. CXR negative. -continue inpatient management, monitor on telemetry -vital signs per unit -blood cultures pending. Antibiotics not indicated at this time. -UDS negative -MRI brain negative for acute stroke -TSH normal at 0.467 -HIV, RPR, and folate pending -B12 elevated at 2033 -Patient did not pass bedside swallow evaluation, SLP eval ordered -PT/OT consult -troponins mildly elevated, trend 0.04 > 0.05 > 0.06, am EKG with LVH -consider consult cardiology today due to  increasing troponins and EKG changes - Wife to get records from [place]  Acute on CKD- creatinine 3.11 on admission, improved to 2.90 after 1 L bolus in ED. noted to be 1.9 last month -Continue maintenance fluids at 125/hr -Creatinine this morning improved to 2.47 -watch for fluid overload with diagnosis of CHF  HTN: takes norvasc 10mg  daily, hydralazine 100mg  TID, losartan 100mg  every day, metoprolol succinate 200mg  every day, spironolactone 25mg  daily, lasix 40mg  daily, clonidine patch q3 days -continue home medications except for Lasix, Losartan, Spironolactone due to AKI and dehydration -monitor blood pressure  Afib- in afib in ED, rate controlled, takes daily aspirin. Not on any other anticoagulation. CHADSVASc is a 7. Think he is likely at high risk for falls.  -monitor on telemetry, regular rhythm this morning -Continue Aspirin 81mg  daily  Type 2 Diabetes- hyperglycemic in ED. Takes novolin 70/30, 32 units with breakfast, 12 units with dinner, which is a daily total of ~30 units of NPH + ~13 units of regular insulin. CBG 175 overnight -Lantus 20 units daily -moderate SSI -CBG q4 hours while NPO  CAD w/ hx of MI and CABG- MI in 1994. daily aspirin 81mg  and lipitor 80mg  daily. Troponin in ED 0.04. No active chest pain. EKG in ED without acute changes. -continue home medications -troponins 0.04 > 0.05 > 0.06 -consider consult to cardiology  History of stroke with residual right sided weakness and dysarthria- Stroke in 1995. Per wife, weakness and dysarthria worse than usual today, patient reports more weakness.  -MRI negative for acute stroke  -discontinue neuro checks -PT/OT consult  Prostate cancer- diagnosed 3-4 years ago, leuprolide injections every 6 months, recently w/ reaction to medication and on 5mg   prednisone BID -continue prednisone -next leuprolide injection May 2018  Constipation -continue home colace and senakot  Hx of Gout- Has colchicine as needed   -monitor, no active flare  FEN/GI: NPO pending SLP eval Prophylaxis: lovenox  Disposition: pending clinical improvement, PT/OT  Subjective:  Collin Fox feels well, denies pain. Still feels weak.  Objective: Temp:  [97.8 F (36.6 C)-98.4 F (36.9 C)] 98.4 F (36.9 C) (11/29 0658) Pulse Rate:  [61-93] 93 (11/29 0658) Resp:  [12-22] 18 (11/29 0658) BP: (140-181)/(77-89) 159/85 (11/29 0658) SpO2:  [97 %-100 %] 99 % (11/29 0658) Weight:  [178 lb (80.7 kg)-225 lb (102.1 kg)] 178 lb (80.7 kg) (11/28 1811) Physical Exam: General: pleasant man, laying in bed, in NAD Cardiovascular: RRR no MRG Respiratory: CTA bilaterally no increased work of breathing Abdomen: soft, nontender, nondistended, +BS Extremities: no edema or cyanosis noted. +2 dorsalis pedis pulses bilaterally  Laboratory:  Recent Labs Lab 05/07/16 1211 05/07/16 1340 05/07/16 2018 05/08/16 0103  WBC 5.1  --  5.0 5.3  HGB 12.2* 13.6 11.9* 11.9*  HCT 37.2* 40.0 35.9* 36.0*  PLT 177  --  177 170    Recent Labs Lab 05/07/16 1211 05/07/16 1340 05/07/16 1838 05/08/16 0103  NA 136 138  --  141  K 4.1 4.1  --  3.5  CL 104 104  --  108  CO2 22  --   --  24  BUN 80* 70*  --  62*  CREATININE 3.11* 2.90* 2.87* 2.47*  CALCIUM 10.3  --   --  9.8  PROT 7.1  --   --   --   BILITOT 0.7  --   --   --   ALKPHOS 92  --   --   --   ALT 33  --   --   --   AST 28  --   --   --   GLUCOSE 382* 390*  --  175*   TSH 0.467 B12 2033 Trop 0.04 > 0.05 > 0.06  Imaging/Diagnostic Tests: Ct Head Wo Contrast  Result Date: 05/07/2016 CLINICAL DATA:  Altered mental status since yesterday EXAM: CT HEAD WITHOUT CONTRAST TECHNIQUE: Contiguous axial images were obtained from the base of the skull through the vertex without intravenous contrast. COMPARISON:  01/21/2016 FINDINGS: Brain: There is extensive encephalomalacia of the left parietal, anterior temporal, and posterior frontal lobes. This is a stable finding. There is also  encephalomalacia in the medial right frontal lobe. There is no mass effect. There is no acute intracranial hemorrhage. Chronic ischemic changes in the periventricular white matter are superimposed. Vascular: No hyperdense vessel or unexpected calcification. Skull: Intact. Sinuses/Orbits: Mastoid air cells are clear. Visualized paranasal sinuses are clear. Prosthetic device is associated with the right globe. This is unchanged in position. Other: None. IMPRESSION: Chronic ischemic changes.  No acute intracranial pathology. Electronically Signed   By: Marybelle Killings M.D.   On: 05/07/2016 13:01   Mr Brain Wo Contrast  Result Date: 05/07/2016 CLINICAL DATA:  70 year old male with altered mental status, increased lethargy, difficulty walking. Prior stroke. Initial encounter. EXAM: MRI HEAD WITHOUT CONTRAST TECHNIQUE: Multiplanar, multiecho pulse sequences of the brain and surrounding structures were obtained without intravenous contrast. COMPARISON:  Head CT without contrast 1248 hours today and earlier. FINDINGS: Brain: Large area of encephalomalacia in the left hemisphere including both the left MCA and PCA territories. Smaller area of encephalomalacia in the superior right frontal gyrus associated with the right ACA vascular territory. Associated white matter  gliosis in both hemispheres. Hemosiderin in the left hemisphere. Wallerian degeneration is evident in the left brainstem. Small superimposed chronic lacunar infarcts in the right thalamus, pons, and left cerebellar hemisphere. No restricted diffusion to suggest acute infarction. No midline shift, mass effect, evidence of mass lesion, ventriculomegaly, extra-axial collection or acute intracranial hemorrhage. Cervicomedullary junction and pituitary are within normal limits. Vascular: Major intracranial vascular flow voids are preserved. Skull and upper cervical spine: Negative. Normal bone marrow signal. Sinuses/Orbits: Postoperative changes to the right globe.  Otherwise negative orbit soft tissues. Visualized paranasal sinuses and mastoids are stable and well pneumatized. Other: Visible internal auditory structures appear normal. Chronic left posterior scalp sebaceous cyst. Otherwise negative scalp soft tissues. IMPRESSION: 1.  No acute intracranial abnormality. 2. Advanced chronic ischemic disease including chronic cortical infarcts in left MCA, left PCA, and right ACA territories. Electronically Signed   By: Genevie Ann M.D.   On: 05/07/2016 17:37   Dg Chest Port 1 View  Result Date: 05/07/2016 CLINICAL DATA:  Altered mental status beginning yesterday. Lethargy and difficulty walking. EXAM: PORTABLE CHEST 1 VIEW COMPARISON:  01/21/2016 FINDINGS: Previous median sternotomy and CABG. Enlarged cardiac silhouette. Aortic atherosclerosis. The lungs are clear. The vascularity is normal. No effusions. No acute bone finding. IMPRESSION: No active disease. Previous CABG. Cardiomegaly. Aortic atherosclerosis. Electronically Signed   By: Nelson Chimes M.D.   On: 05/07/2016 12:45     Steve Rattler, DO 05/08/2016, 7:17 AM PGY-1, [place] Intern pager: [phone], text pages welcome

## 2016-05-08 NOTE — Evaluation (Signed)
Physical Therapy Evaluation Patient Details Name: Collin Fox MRN: [id number] DOB: [birth year] Today's Date: 05/08/2016   History of Present Illness   70 y.o. male presenting with altered mental status. PMH is significant for HTN, CHF, CKD, hx CVA with residual dysarthria, T2DM, hx MI with CABG, hx prostate cancer  Clinical Impression  Patient demonstrates deficits in functional mobility as indicated below. Will need continued skilled PT to address deficits and maximize function. Will see as indicated and progress as tolerated. Will need SNF upon acute discharge.    Follow Up Recommendations SNF;Supervision/Assistance - 24 hour    Equipment Recommendations  None recommended by PT    Recommendations for Other Services       Precautions / Restrictions Precautions Precautions: Fall Restrictions Weight Bearing Restrictions: No      Mobility  Bed Mobility               General bed mobility comments: received in chair  Transfers Overall transfer level: Needs assistance Equipment used: Rolling walker (2 wheeled) Transfers: Sit to/from Stand Sit to Stand: Min assist         General transfer comment: min assist to power up to standing, min assist for stability with use of RW  Ambulation/Gait Ambulation/Gait assistance: Min assist Ambulation Distance (Feet): 90 Feet Assistive device: Rolling walker (2 wheeled) Gait Pattern/deviations: Step-through pattern;Decreased stride length;Shuffle;Drifts right/left;Narrow base of support;Trunk flexed     General Gait Details: patient required min assist for stability and manual assist for control of RW as patient continuously running into walls and objects in hall. 1 standing rest break required. VCs for safety, however, patient appears poorly receptive due to hearing impairment and cognition  Stairs            Wheelchair Mobility    Modified Rankin (Stroke Patients Only)       Balance Overall balance  assessment: History of Falls                                           Pertinent Vitals/Pain Pain Assessment: No/denies pain    Home Living Family/patient expects to be discharged to:: Skilled nursing facility Living Arrangements: Alone                    Prior Function           Comments: patient is poor historian     Hand Dominance   Dominant Hand: Right    Extremity/Trunk Assessment   Upper Extremity Assessment: Generalized weakness           Lower Extremity Assessment: Generalized weakness (poor overall coordination of BLEs)      Cervical / Trunk Assessment: Kyphotic  Communication   Communication: HOH;Expressive difficulties  Cognition Arousal/Alertness: Awake/alert Behavior During Therapy: WFL for tasks assessed/performed Overall Cognitive Status: History of cognitive impairments - at baseline                      General Comments      Exercises     Assessment/Plan    PT Assessment Patient needs continued PT services  PT Problem List Decreased strength;Decreased activity tolerance;Decreased balance;Decreased mobility;Decreased coordination;Decreased cognition;Decreased safety awareness;Cardiopulmonary status limiting activity          PT Treatment Interventions DME instruction;Gait training;Functional mobility training;Therapeutic activities;Therapeutic exercise;Balance training;Patient/family education;Cognitive remediation    PT Goals (Current goals can be  found in the Care Plan section)  Acute Rehab PT Goals Patient Stated Goal: none stated PT Goal Formulation: With patient Time For Goal Achievement: 05/22/16 Potential to Achieve Goals: Good    Frequency Min 2X/week   Barriers to discharge Decreased caregiver support      Co-evaluation               End of Session Equipment Utilized During Treatment: Gait belt Activity Tolerance: Patient limited by fatigue Patient left: in chair;with call  bell/phone within reach;with chair alarm set Nurse Communication: Mobility status         Time: 1350-1408 PT Time Calculation (min) (ACUTE ONLY): 18 min   Charges:   PT Evaluation $PT Eval Moderate Complexity: 1 Procedure     PT G Codes:        Duncan Dull May 11, 2016, 6:54 PM Alben Deeds, [place] DPT  [phone]

## 2016-05-08 NOTE — Evaluation (Signed)
Occupational Therapy Evaluation Patient Details Name: Collin Fox MRN: [id number] DOB: 1946/01/23 Today's Date: 05/08/2016    History of Present Illness  70 y.o. male presenting with altered mental status. PMH is significant for HTN, CHF, CKD, hx CVA with residual dysarthria, T2DM, hx MI with CABG, hx prostate cancer   Clinical Impression   Pt admitted with above. He demonstrates the below listed deficits and will benefit from continued OT to maximize safety and independence with BADLs.  Pt requires min A for ADLs.  Recommend SNF level rehab.       Follow Up Recommendations  SNF;Supervision/Assistance - 24 hour    Equipment Recommendations  None recommended by OT    Recommendations for Other Services       Precautions / Restrictions Precautions Precautions: Fall Restrictions Weight Bearing Restrictions: No      Mobility Bed Mobility               General bed mobility comments: received in chair  Transfers Overall transfer level: Needs assistance Equipment used: Rolling walker (2 wheeled) Transfers: Sit to/from [facility] Sit to Stand: Min assist Stand pivot transfers: Min assist       General transfer comment: min assist to power up to standing, min assist for stability with use of RW    Balance Overall balance assessment: History of Falls                                          ADL Overall ADL's : Needs assistance/impaired Eating/Feeding: Set up;Sitting   Grooming: Wash/dry hands;Wash/dry face;Oral care;Brushing hair;Set up;Supervision/safety;Sitting   Upper Body Bathing: Supervision/ safety;Set up;Sitting   Lower Body Bathing: Minimal assistance;Sit to/from stand   Upper Body Dressing : Minimal assistance;Sitting   Lower Body Dressing: Minimal assistance;Sit to/from stand   Toilet Transfer: Minimal assistance;Stand-pivot;BSC;Grab bars;RW   Toileting- Clothing Manipulation and Hygiene: Minimal  assistance;Sit to/from stand       Functional mobility during ADLs: Minimal assistance;Rolling walker General ADL Comments: pt incontinent of urine      Vision Additional Comments: Pt appears to have Rt visual field deficit    Perception     Praxis      Pertinent Vitals/Pain Pain Assessment: No/denies pain     Hand Dominance Right   Extremity/Trunk Assessment Upper Extremity Assessment Upper Extremity Assessment: Generalized weakness   Lower Extremity Assessment Lower Extremity Assessment: Defer to PT evaluation   Cervical / Trunk Assessment Cervical / Trunk Assessment: Kyphotic   Communication Communication Communication: HOH;Expressive difficulties   Cognition Arousal/Alertness: Awake/alert Behavior During Therapy: WFL for tasks assessed/performed Overall Cognitive Status: No family/caregiver present to determine baseline cognitive functioning                     General Comments       Exercises       Shoulder Instructions      Home Living Family/patient expects to be discharged to:: Skilled nursing facility Living Arrangements: Alone                                      Prior Functioning/Environment          Comments: patient is poor historian        OT Problem List: Decreased strength;Decreased activity tolerance;Impaired balance (sitting and/or  standing);Decreased cognition;Decreased safety awareness;Decreased knowledge of use of DME or AE;Impaired vision/perception   OT Treatment/Interventions: Self-care/ADL training;DME and/or AE instruction;Therapeutic activities;Cognitive remediation/compensation;Visual/perceptual remediation/compensation;Patient/family education;Balance training    OT Goals(Current goals can be found in the care plan section) Acute Rehab OT Goals Patient Stated Goal: none stated OT Goal Formulation: With patient Time For Goal Achievement: 05/22/16 Potential to Achieve Goals: Good ADL Goals Pt  Will Perform Grooming: with min guard assist;standing Pt Will Perform Lower Body Bathing: with min guard assist;sit to/from stand Pt Will Perform Lower Body Dressing: with min guard assist;sit to/from stand Pt Will Transfer to Toilet: with min guard assist;ambulating;regular height toilet;bedside commode;grab bars Pt Will Perform Toileting - Clothing Manipulation and hygiene: with min guard assist;sit to/from stand  OT Frequency: Min 2X/week   Barriers to D/C: Decreased caregiver support          Co-evaluation              End of Session Equipment Utilized During Treatment: Surveyor, mining Communication: Mobility status  Activity Tolerance: Patient tolerated treatment well Patient left: in chair;with call bell/phone within reach;with chair alarm set   Time: 1250-1330 OT Time Calculation (min): 40 min Charges:  OT General Charges $OT Visit: 1 Procedure OT Evaluation $OT Eval Moderate Complexity: 1 Procedure OT Treatments $Self Care/Home Management : 23-37 mins G-Codes:    Cleotha Tsang M 05/17/16, 7:03 PM
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
## 2016-05-09 LAB — CBC
HEMATOCRIT: 35.8 % — AB (ref 39.0–52.0)
HEMOGLOBIN: 11.5 g/dL — AB (ref 13.0–17.0)
MCH: 28.5 pg (ref 26.0–34.0)
MCHC: 32.1 g/dL (ref 30.0–36.0)
MCV: 88.6 fL (ref 78.0–100.0)
Platelets: 164 10*3/uL (ref 150–400)
RBC: 4.04 MIL/uL — AB (ref 4.22–5.81)
RDW: 13.8 % (ref 11.5–15.5)
WBC: 5.4 10*3/uL (ref 4.0–10.5)

## 2016-05-09 LAB — GLUCOSE, CAPILLARY
GLUCOSE-CAPILLARY: 277 mg/dL — AB (ref 65–99)
GLUCOSE-CAPILLARY: 398 mg/dL — AB (ref 65–99)
Glucose-Capillary: 171 mg/dL — ABNORMAL HIGH (ref 65–99)
Glucose-Capillary: 308 mg/dL — ABNORMAL HIGH (ref 65–99)

## 2016-05-09 LAB — BASIC METABOLIC PANEL
ANION GAP: 8 (ref 5–15)
BUN: 48 mg/dL — ABNORMAL HIGH (ref 6–20)
CALCIUM: 9.1 mg/dL (ref 8.9–10.3)
CO2: 24 mmol/L (ref 22–32)
Chloride: 108 mmol/L (ref 101–111)
Creatinine, Ser: 2.05 mg/dL — ABNORMAL HIGH (ref 0.61–1.24)
GFR calc non Af Amer: 31 mL/min — ABNORMAL LOW (ref >60)
GFR, EST AFRICAN AMERICAN: 36 mL/min — AB (ref >60)
Glucose, Bld: 215 mg/dL — ABNORMAL HIGH (ref 65–99)
POTASSIUM: 3.8 mmol/L (ref 3.5–5.1)
Sodium: 140 mmol/L (ref 135–145)

## 2016-05-09 MED ORDER — ENOXAPARIN SODIUM 40 MG/0.4ML ~~LOC~~ SOLN
40.0000 mg | SUBCUTANEOUS | Status: DC
  Administered 2016-05-09: 40 mg via SUBCUTANEOUS
  Filled 2016-05-09: qty 0.4

## 2016-05-09 NOTE — Progress Notes (Signed)
Family Medicine Teaching Service Daily Progress Note Intern Pager: [phone]  Patient name: Collin Fox Medical record number: [id number] Date of birth: 1945-11-19 Age: 70 y.o. Gender: male  Primary Care Provider: [facility] Consultants: none Code Status: full  Pt Overview and Major Events to Date:  11/28- Admitted to telemetry  Assessment and Plan: Collin Fox is a 69 y.o. male presenting with altered mental status. PMH is significant for HTN, CHF, CKD, hx CVA with residual dysarthria, T2DM, hx MI with CABG, hx prostate cancer.  AMS- improved: began yesterday evening with weakness and increased drowsiness, per family. Vital signs stable in ED. Concern for stroke, patient has history of afib and 2 previous strokes, and is not currently anticoagulated. Does not appear to have new weakness on exam but per family is not acting his usual self. Neuro exam is seemingly at baseline, although is difficult to assess. Differential also includes dehydration as patient's creatinine and BUN are increased on admission versus new cardiac event vs infection. Per patient's daughter, patient has had altered mental status exactly like this in the past when he has become dehydrated. Pt taking Lasix and Spironolactone at home. UA with >1000 glucose, so osmotic diuresis is also likely contributing. Infection less likely as patient has no white count (5.1), is afebrile, and lactic acid is normal at 1.58. UA without leukocytes or nitrites. CXR negative. UDS negative, MRI brain negative for acute stroke. Troponins stable, EKG without changes -continue to monitor on telemetry -vital signs per unit -blood cultures no growth x1 day, no indications for antibiotics at this time -HIV non-reactive -RPR non-reactive -Folate normal -PT/OT recommending SNF placement, family is agreeable  - Wife to get records from [place]  Acute on CKD- improved- creatinine 3.11 on admission, improved to 2.90 after 1 L bolus  in ED. noted to be 1.9 last month -taking good PO -Creatinine this morning improved further to 2.05, at patient's baseline.  Anemia- hemoglobin 12.2 on admission, appears to be his baseline. Report of BM with blood in it per nursing 11/29.  -rectal exam without obvious hemorrhoids, no fissures -will obtain further history from daughter -FOBT negative -Hemoglobin trend 12.2 > 11.9 > 11.5 -monitor  HTN- stable: takes norvasc 10mg  daily, hydralazine 100mg  TID, losartan 100mg  every day, metoprolol succinate 200mg  every day, spironolactone 25mg  daily, lasix 40mg  daily, clonidine patch q3 days. Blood pressure 135/93 this morning. -continue home medications except for Lasix, Losartan, Spironolactone due to AKI and dehydration -continue to monitor blood pressure  Afib- rate controlled, no anticoagulation- in afib in ED, rate controlled, takes daily aspirin. Not on any other anticoagulation. CHADSVASc is a 7. Think he is likely at high risk for falls.  -monitor on telemetry -Continue Aspirin 81mg  daily  Type 2 Diabetes- hyperglycemic in ED. Takes novolin 70/30, 32 units with breakfast, 12 units with dinner, which is a daily total of ~30 units of NPH + ~13 units of regular insulin. CBG 171 this morning. -continue Lantus 20 units daily -moderate SSI -CBG AC/HS   CAD w/ hx of MI and CABG- MI in 1994. daily aspirin 81mg  and lipitor 80mg  daily. Troponin in ED 0.04. No active chest pain. EKG in ED without acute changes. -continue home medications -troponins 0.04 > 0.05 > 0.06 -consider consult to cardiology  History of stroke with residual right sided weakness and dysarthria- Stroke in 1995. Per wife, weakness and dysarthria worse than usual today, patient reports more weakness.  -MRI negative for acute stroke  -discontinue neuro checks -  PT/OT consult  Prostate cancer- diagnosed 3-4 years ago, leuprolide injections every 6 months, recently w/ reaction to medication and on 5mg  prednisone  BID -continue prednisone -next leuprolide injection May 2018  Constipation- chronic problem for patient -continue home colace and senakot  Hx of Gout- Has colchicine as needed  -monitor, no active flare  FEN/GI: heart healthy diet Prophylaxis: lovenox  Disposition: SNF  Subjective:  Collin Fox feels well, denies pain. Still feels weak.  Objective: Temp:  [97.9 F (36.6 C)-98.1 F (36.7 C)] 97.9 F (36.6 C) (11/30 0440) Pulse Rate:  [58-74] 67 (11/30 0440) Resp:  [14-18] 14 (11/30 0440) BP: (135-178)/(73-93) 135/93 (11/30 0440) SpO2:  [100 %] 100 % (11/30 0440) Physical Exam: General: pleasant man, laying in bed, in NAD Cardiovascular: RRR no MRG Respiratory: CTA bilaterally no increased work of breathing Abdomen: soft, nontender, nondistended, +BS Rectal exam: no external hemorrhoids seen, no fissures seen, normal rectal tone, no masses appreciated, moderate stool burden in rectal vault Extremities: no edema or cyanosis noted. +2 dorsalis pedis pulses bilaterally  Laboratory:  Recent Labs Lab 05/07/16 2018 05/08/16 0103 05/09/16 0348  WBC 5.0 5.3 5.4  HGB 11.9* 11.9* 11.5*  HCT 35.9* 36.0* 35.8*  PLT 177 170 164    Recent Labs Lab 05/07/16 1211 05/07/16 1340 05/07/16 1838 05/08/16 0103 05/09/16 0348  NA 136 138  --  141 140  K 4.1 4.1  --  3.5 3.8  CL 104 104  --  108 108  CO2 22  --   --  24 24  BUN 80* 70*  --  62* 48*  CREATININE 3.11* 2.90* 2.87* 2.47* 2.05*  CALCIUM 10.3  --   --  9.8 9.1  PROT 7.1  --   --   --   --   BILITOT 0.7  --   --   --   --   ALKPHOS 92  --   --   --   --   ALT 33  --   --   --   --   AST 28  --   --   --   --   GLUCOSE 382* 390*  --  175* 215*   TSH 0.467 B12 2033 Trop 0.04 > 0.05 > 0.06  Imaging/Diagnostic Tests: No results found.   Steve Rattler, DO 05/09/2016, 11:51 AM PGY-1, [place] Intern pager: [phone], text pages welcome

## 2016-05-09 NOTE — Progress Notes (Signed)
Inpatient Diabetes Program Recommendations  AACE/ADA: New Consensus Statement on Inpatient Glycemic Control (2015)  Target Ranges:  Prepandial:   less than 140 mg/dL      Peak postprandial:   less than 180 mg/dL (1-2 hours)      Critically ill patients:  140 - 180 mg/dL   Lab Results  Component Value Date   GLUCAP 277 (H) 05/09/2016   HGBA1C 7.8 (H) 05/23/2012    Review of Glycemic Control:  Results for BRYNDEN, TIDD (MRN [id number]) as of 05/09/2016 13:05  Ref. Range 05/08/2016 12:06 05/08/2016 17:02 05/08/2016 20:46 05/09/2016 07:56 05/09/2016 11:57  Glucose-Capillary Latest Ref Range: 65 - 99 mg/dL 152 (H) 377 (H) 356 (H) 171 (H) 277 (H)   Inpatient Diabetes Program Recommendations:    Please consider Novolog meal coverage 4 units tid with meals (Hold if patient eats less than 50%).  Thanks, Adah Perl, RN, BC-ADM Inpatient Diabetes Coordinator Pager [phone] (8a-5p)

## 2016-05-09 NOTE — Discharge Summary (Signed)
Collin Fox  Patient name: Collin Fox Medical record number: [id number] Date of birth: September 20, 1945 Age: 70 y.o. Gender: male Date of Admission: 05/07/2016  Date of Discharge: 05/10/16  Admitting Physician: Lupita Dawn, MD  Primary Care Provider: [facility] Consultants: none  Indication for Hospitalization: AMS, AKI  Discharge Diagnoses/Problem List:  Altered Mental Status Type 2 Diabetes Acute on CKD Stage 3 HTN Paroxysmal Atrial Fibrillation  Disposition: SNF  Discharge Condition: stable, improved  Discharge Exam:  Gen: pleasant elderly man laying in bed in NAD Card: RRR no murmurs rubs or gallops Lungs: CTA bilaterally, no increased work of breathing Abdomen: soft, non-tender, non-distended, +BS Back: large discolored patch of skin covering midback Extremities: warm, well perfused, no edema or cyanosis.  Neuro: dysarthria present, 3/5 strength in right extremities, 5/5 in left extremities  Brief Hospital Course:   Collin Fox is a 70 year old male with a PMH of active prostate cancer, HTN, CHF, CKD, hx CVAwith residual dysarthria in the 90's, T2DM, and hx MI with CABG in the 90's who presented to [facility] ED with AMS and AKI.  He was admitted to [facility] on 05/07/16 and given IV fluids for his AKI. MRI head was negative for new acute stroke. EKG and troponin did not show evidence of acute MI. He was noted to be in atrial fibrillation at time of admission. He is not on anticoagulation due to being a high fall risk. Blood and urine cultures were negative. HIV and RPR were negative. Folate level was normal. His home blood pressure medications were continued except for those that may have contributed to AKI and dehydration. During admission he was noted to have a bowel movement with bright red blood in it. FOBT was negative. Rectal exam was normal. Patient has chronic constipation and hard stools. He has been  followed by the [facility] for scheduling outpatient colonoscopy, has not been done due to poor anesthesia candidate. PT/OT evaluated the patient and recommended him going to SNF, which family agreed with and felt was safest. Creatinine trended down from 3.11 on admission back to his baseline of 2.05 at time of discharge. He was discharged to [facility] on 05/10/16.   Issues for Follow Up:  1. May need to titrate up dose of Lantus based on fasting sugars, discharged on 25 units every night 2. May need to adjust SSI once prednisone is discontinued per his outpatient Urologist 3. Follow with urology recommendations for active prostate cancer. Sees [place] urologist. [place] social worker should be sending your facility his medical records.  4. Noted to have bloody bowel movement by a nurse tech, FOBT negative. Rectal exam normal, Hgb stable. Please follow up age-appropriate screening as outpatient.  5. Home Lasix (20mg  daily) held at time of discharge due to patient appearing euvolemic and his dehydration on admission, consider re-adding.   Significant Procedures: none  Significant Labs and Imaging:   Recent Labs Lab 05/08/16 0103 05/09/16 0348 05/10/16 0819  WBC 5.3 5.4 5.4  HGB 11.9* 11.5* 11.5*  HCT 36.0* 35.8* 34.7*  PLT 170 164 136*    Recent Labs Lab 05/07/16 1211 05/07/16 1340 05/07/16 1838 05/08/16 0103 05/09/16 0348  NA 136 138  --  141 140  K 4.1 4.1  --  3.5 3.8  CL 104 104  --  108 108  CO2 22  --   --  24 24  GLUCOSE 382* 390*  --  175* 215*  BUN 80* 70*  --  62* 48*  CREATININE 3.11* 2.90* 2.87* 2.47* 2.05*  CALCIUM 10.3  --   --  9.8 9.1  ALKPHOS 92  --   --   --   --   AST 28  --   --   --   --   ALT 33  --   --   --   --   ALBUMIN 3.7  --   --   --   --     FOBT negative  Results/Tests Pending at Time of Discharge: none  Discharge Medications:    Medication List    STOP taking these medications   furosemide 40 MG tablet Commonly known  as:  LASIX   insulin NPH-regular Human (70-30) 100 UNIT/ML injection Commonly known as:  NOVOLIN 70/30     TAKE these medications   abiraterone Acetate 250 MG tablet Commonly known as:  ZYTIGA Take 1,000 mg by mouth every morning. Take on an empty stomach 1 hour before or 2 hours after a meal   amLODipine 10 MG tablet Commonly known as:  NORVASC Take 10 mg by mouth every morning.   ammonium lactate 12 % lotion Commonly known as:  LAC-HYDRIN Apply 1 application topically daily.   aspirin 81 MG chewable tablet Chew 81 mg by mouth every morning.   atorvastatin 80 MG tablet Commonly known as:  LIPITOR Take 80 mg by mouth daily at 6 PM.   brimonidine 0.2 % ophthalmic solution Commonly known as:  ALPHAGAN Place 1 drop into both eyes 3 (three) times daily.   carbamazepine 300 MG 12 hr capsule Commonly known as:  CARBATROL Take 600 mg by mouth 2 (two) times daily.   cloNIDine 0.2 mg/24hr patch Commonly known as:  CATAPRES - Dosed in mg/24 hr Place 1 patch onto the skin every 3 (three) days.   colchicine 0.6 MG tablet Take 2 tabs po x 1 now, then 1 tab an hour later. Even if gout re-flairs, do not repeat for a week. What changed:  how much to take  how to take this  when to take this  reasons to take this  additional instructions   Diclofenac Sodium 1 % Crea Place 1 application onto the skin 4 (four) times daily as needed. To right large toe What changed:  reasons to take this  additional instructions   docusate sodium 100 MG capsule Commonly known as:  COLACE Take 100 mg by mouth 2 (two) times daily. For constipation   dorzolamide-timolol 22.3-6.8 MG/ML ophthalmic solution Commonly known as:  COSOPT Place 1 drop into both eyes daily.   feeding supplement (GLUCERNA SHAKE) Liqd Take 237 mLs by mouth 3 (three) times daily between meals.   hydrALAZINE 100 MG tablet Commonly known as:  APRESOLINE Take 100 mg by mouth 3 (three) times daily.   insulin  aspart 100 UNIT/ML injection Commonly known as:  novoLOG Inject 0-20 Units into the skin 3 (three) times daily with meals.   insulin glargine 100 UNIT/ML injection Commonly known as:  LANTUS Inject 0.25 mLs (25 Units total) into the skin at bedtime.   latanoprost 0.005 % ophthalmic solution Commonly known as:  XALATAN Place 1 drop into both eyes at bedtime.   leuprolide (6 Month) 45 MG injection Commonly known as:  ELIGARD Inject 45 mg into the skin every 6 (six) months.   losartan 100 MG tablet Commonly known as:  COZAAR Take 100 mg by mouth every morning.   metoprolol 200  MG 24 hr tablet Commonly known as:  TOPROL-XL Take 200 mg by mouth every morning.   predniSONE 5 MG tablet Commonly known as:  DELTASONE Take 5 mg by mouth 2 (two) times daily with a meal.   senna 8.6 MG Tabs tablet Commonly known as:  SENOKOT Take 1 tablet by mouth daily. For constipation   spironolactone 25 MG tablet Commonly known as:  ALDACTONE Take 50 mg by mouth 2 (two) times daily.       Discharge Instructions: Please refer to Patient Instructions section of EMR for full details.  Patient was counseled important signs and symptoms that should prompt return to medical care, changes in medications, dietary instructions, activity restrictions, and follow up appointments.   Follow-Up Appointments:   Steve Rattler, DO 05/10/2016, 2:41 PM PGY-1, [place]

## 2016-05-09 NOTE — Progress Notes (Addendum)
Bed offers provided to patient daughter- no choice at this time.  Plan for DC to SNF 12/1 if stable  CSW faxed medical records to [facility] social worker (fax # [phone]) to help her begin the process for LTC for patient  CSW will continue to follow  Jorge Ny, [place] Social Worker [phone]

## 2016-05-10 DIAGNOSIS — R4182 Altered mental status, unspecified: Secondary | ICD-10-CM

## 2016-05-10 LAB — CBC
HCT: 34.7 % — ABNORMAL LOW (ref 39.0–52.0)
HEMOGLOBIN: 11.5 g/dL — AB (ref 13.0–17.0)
MCH: 28.8 pg (ref 26.0–34.0)
MCHC: 33.1 g/dL (ref 30.0–36.0)
MCV: 86.8 fL (ref 78.0–100.0)
PLATELETS: 136 10*3/uL — AB (ref 150–400)
RBC: 4 MIL/uL — ABNORMAL LOW (ref 4.22–5.81)
RDW: 13.9 % (ref 11.5–15.5)
WBC: 5.4 10*3/uL (ref 4.0–10.5)

## 2016-05-10 LAB — GLUCOSE, CAPILLARY
GLUCOSE-CAPILLARY: 243 mg/dL — AB (ref 65–99)
GLUCOSE-CAPILLARY: 227 mg/dL — AB (ref 65–99)
Glucose-Capillary: 246 mg/dL — ABNORMAL HIGH (ref 65–99)

## 2016-05-10 MED ORDER — GLUCERNA SHAKE PO LIQD: 237.0000 mL | Freq: Three times a day (TID) | ORAL | 90 refills | Status: DC

## 2016-05-10 MED ORDER — INSULIN GLARGINE 100 UNIT/ML ~~LOC~~ SOLN: 25.0000 [IU] | Freq: Every day | SUBCUTANEOUS | 11 refills | Status: DC

## 2016-05-10 MED ORDER — INSULIN ASPART 100 UNIT/ML ~~LOC~~ SOLN: 0.0000 [IU] | Freq: Three times a day (TID) | SUBCUTANEOUS | 11 refills | Status: DC

## 2016-05-10 NOTE — Care Management Note (Signed)
Case Management Note  Patient Details  Name: MAHKI SPENO MRN: [id number] Date of Birth: [birth year]  Subjective/Objective:                 Admitted for Encephalopathy.    Action/Plan:  Will DC to SNF.   Expected Discharge Date:                  Expected Discharge Plan:  Skilled Nursing Facility  In-House Referral:  Clinical Social Work  Discharge planning Services  CM Consult  Post Acute Care Choice:    Choice offered to:     DME Arranged:    DME Agency:     HH Arranged:    [place] Agency:     Status of Service:  Completed, signed off  If discussed at [employer], dates discussed:    Additional Comments:  Carles Collet, RN 05/10/2016, 1:27 PM

## 2016-05-10 NOTE — Progress Notes (Addendum)
Patient will DC to: [facility] Anticipated DC date: 05/10/16 Family notified: Daughter Transport by: Corey Harold   Per MD patient ready for DC to [place]. RN, patient, patient's family, and facility notified of DC. Discharge Summary sent to facility. RN given number for report. DC packet on chart. Ambulance transport requested for patient.   CSW [facility] letting her know pt discharged to [facility] and faxed requested documents.   CSW signing off.  Cedric Fishman, [place] Social Worker [phone]

## 2016-05-10 NOTE — Care Management Important Message (Signed)
Important Message  Patient Details  Name: Collin Fox MRN: [id number] Date of Birth: 04-28-1946   Medicare Important Message Given:  Yes    Nathen May 05/10/2016, 1:09 PM

## 2016-05-10 NOTE — Progress Notes (Signed)
Inpatient Diabetes Program Recommendations  AACE/ADA: New Consensus Statement on Inpatient Glycemic Control (2015)  Target Ranges:  Prepandial:   less than 140 mg/dL      Peak postprandial:   less than 180 mg/dL (1-2 hours)      Critically ill patients:  140 - 180 mg/dL   Lab Results  Component Value Date   GLUCAP 227 (H) 05/10/2016   HGBA1C 7.8 (H) 05/23/2012    Review of Glycemic Control Results for Collin Fox, Collin Fox (MRN [id number]) as of 05/10/2016 14:02  Ref. Range 05/09/2016 11:57 05/09/2016 17:00 05/09/2016 21:45 05/10/2016 07:42 05/10/2016 11:38  Glucose-Capillary Latest Ref Range: 65 - 99 mg/dL 277 (H) 398 (H) 308 (H) 243 (H) 227 (H)   Inpatient Diabetes Program Recommendations:   Please consider Novolog meal coverage 4 units tid with meals (Hold if patient eats less than 50%).  Thank you, Nani Gasser. Jeson Camacho, RN, MSN, CDE Inpatient Glycemic Control Team Team Pager [phone] (8am-5pm) 05/10/2016 2:02 PM

## 2016-05-10 NOTE — Clinical Social Work Placement (Signed)
   CLINICAL SOCIAL WORK PLACEMENT  NOTE  Date:  05/10/2016  Patient Details  Name: Collin Fox MRN: [id number] Date of Birth: 07/01/1945  Clinical Social Work is seeking post-discharge placement for this patient at the [place] level of care (*CSW will initial, date and re-position this form in  chart as items are completed):  Yes   Patient/family provided with [place] Work Department's list of facilities offering this level of care within the geographic area requested by the patient (or if unable, by the patient's family).  Yes   Patient/family informed of their freedom to choose among providers that offer the needed level of care, that participate in Medicare, Medicaid or managed care program needed by the patient, have an available bed and are willing to accept the patient.  Yes   Patient/family informed of [facility]'s ownership interest in [facility] and [facility], as well as of the fact that they are under no obligation to receive care at these facilities.  PASRR submitted to EDS on 05/08/16     PASRR number received on 05/08/16     Existing PASRR number confirmed on       FL2 transmitted to all facilities in geographic area requested by pt/family on 05/08/16     FL2 transmitted to all facilities within larger geographic area on       Patient informed that his/her managed care company has contracts with or will negotiate with certain facilities, including the following:        Yes   Patient/family informed of bed offers received.  Patient chooses bed at [place] recommends and patient chooses bed at      Patient to be transferred to [facility] on 05/10/16.  Patient to be transferred to facility by [facility]     Patient family notified on 05/10/16 of transfer.  Name of family member notified:  Daughter     PHYSICIAN Please sign FL2     Additional Comment:     _______________________________________________ Benard Halsted, [place] 05/10/2016, 2:57 PM

## 2016-05-12 LAB — CULTURE, BLOOD (ROUTINE X 2)
CULTURE: NO GROWTH
Culture: NO GROWTH

## 2016-05-14 ENCOUNTER — Encounter: Payer: Self-pay | Admitting: Internal Medicine

## 2016-05-14 ENCOUNTER — Non-Acute Institutional Stay (SKILLED_NURSING_FACILITY): Payer: Self-pay | Admitting: Internal Medicine

## 2016-05-14 DIAGNOSIS — IMO0002 Reserved for concepts with insufficient information to code with codable children: Secondary | ICD-10-CM | POA: Insufficient documentation

## 2016-05-14 DIAGNOSIS — R4182 Altered mental status, unspecified: Secondary | ICD-10-CM

## 2016-05-14 DIAGNOSIS — E1365 Other specified diabetes mellitus with hyperglycemia: Secondary | ICD-10-CM

## 2016-05-14 DIAGNOSIS — N179 Acute kidney failure, unspecified: Secondary | ICD-10-CM

## 2016-05-14 DIAGNOSIS — E1351 Other specified diabetes mellitus with diabetic peripheral angiopathy without gangrene: Secondary | ICD-10-CM

## 2016-05-14 DIAGNOSIS — C61 Malignant neoplasm of prostate: Secondary | ICD-10-CM | POA: Insufficient documentation

## 2016-05-14 NOTE — Assessment & Plan Note (Signed)
05/14/16 is unable to get me the date or the name of the president. Vascular dementia suggested

## 2016-05-14 NOTE — Assessment & Plan Note (Signed)
As per Urology

## 2016-05-14 NOTE — Patient Instructions (Signed)
See Current Assessment & Plan in Problem List under specific Diagnosis 

## 2016-05-14 NOTE — Progress Notes (Signed)
Facility Location: [facility]  Room Number: 215-A  Code Status: Full Code   PCP: [facility] [id number] [place] [id number]   This is a comprehensive admission note to [facility] performed on this date less than 30 days from date of admission. Included are preadmission medical/surgical history;reconciled medication list; family history; social history and comprehensive review of systems.  Corrections and additions to the records were documented . Comprehensive physical exam was also performed. Additionally a clinical summary was entered for each active diagnosis pertinent to this admission in the Problem List to enhance continuity of care. Note: Insulin-dependent diabetes and prostate cancer were not listed on the problem list  HPI: This gentleman with multiple comorbidities was hospitalized 11/28-12/1/17 with altered mental status in the context of acute renal insufficiency superimposed on chronic renal disease. He thought he was hospitalized with stroke, but the MRI did not reveal new vascular event. The patient had a stroke in the 1990s and has residual dysarthria. He lives with his wife who is his primary caregiver. Apparently his care needs are somewhat overwhelming to his wife according to history. At admission he was in atrial fibrillation. He is not on anticoagulation due to a high fall risk. While in hospital blood was noted on bowel movement. FOBT and rectal exam were negative. The bleeding was attributed to constipation and hard stools. He has been followed at the [facility] for his multiple conditions but follow-up has been spotty. In reviewing the records he's been seen mainly in the urgent care or ER. Colonoscopy has not been pursued at the [place] because of his multiple comorbidities. With hydration creatinine decreased from 3.11 at admission to a baseline of 2.05. He was transferred to the SNF for  rehabilitation. He is on oral chemotherapy agent & leuprolide injections every 6 month for prostate cancer. Apparently part of the combination chemotherapy is prednisone 5 mg twice a day.  Past medical and surgical history: Includes type 2 diabetes, insulin-dependent; past history of stroke; hypertension dyslipidemia; history of myocardial infarction; atrial fibrillation and chronic kidney disease. Past surgeries include bypass grafting and prostate biopsy.   Family/social history: Reviewed in Epic. He was not able to provide updates.  Review of systems: Despite pre meal and basal insulin glucoses have been 400 or higher persistently while at the SNF. All answers were "no", but veracity in question due to dementia. Neither the president nor the date could be provided.  Constitutional: No fever,significant weight change, fatigue  Eyes: No redness, discharge, pain, vision change ENT/mouth: No nasal congestion,  purulent discharge, earache,change in hearing ,sore throat  Cardiovascular: No chest pain, palpitations,paroxysmal nocturnal dyspnea, claudication, edema  Respiratory: No cough, sputum production,hemoptysis, DOE , significant snoring,apnea  Gastrointestinal: No heartburn,dysphagia,abdominal pain, nausea / vomiting,rectal bleeding, melena,change in bowels Genitourinary: No dysuria,hematuria, pyuria,  incontinence, nocturia Musculoskeletal: No joint stiffness, joint swelling, weakness,pain Dermatologic: No rash, pruritus, change in appearance of skin Neurologic: No dizziness,headache,syncope, seizures, numbness , tingling Psychiatric: No significant anxiety , depression, insomnia, anorexia Endocrine: No change in hair/skin/ nails, excessive thirst, excessive hunger, excessive urination  Hematologic/lymphatic: No significant bruising, lymphadenopathy,abnormal bleeding Allergy/immunology: No itchy/ watery eyes, significant sneezing, urticaria, angioedema  Physical exam:  Pertinent or  positive findings: His head is shaven, he has a beard and mustache which are close cropped. Dysarthria is persistent.  There is thickening of the sclera of the right ete laterally. Arcus senilis is present. He has ptosis on the left. Heart rhythm and  rate are irregular. He has trace pedal edema. Pedal pulses are decreased. He is slightly weak in the left upper and left lower extremities compared to the right. He has a tattoo over the left upper extremity. There is a well-healed scar obliquely across the tattoo. He has a very large hemangioma over the t back measuring 19 x 24 cm  General appearance:Adequately nourished; no acute distress , increased work of breathing is present.   Lymphatic: No lymphadenopathy about the head, neck, axilla . Eyes: No conjunctival inflammation or lid edema is present. There is no scleral icterus. Ears:  External ear exam shows no significant lesions or deformities.   Nose:  External nasal examination shows no deformity or inflammation. Nasal mucosa are pink and moist without lesions ,exudates Oral exam: lips and gums are healthy appearing.There is no oropharyngeal erythema or exudate . Neck:  No thyromegaly, masses, tenderness noted.    Heart:  No gallop, murmur, click, rub .  Lungs:Chest clear to auscultation without wheezes, rhonchi,rales , rubs. Abdomen:Bowel sounds are normal. Abdomen is soft and nontender with no organomegaly, hernias,masses. GU: deferred as previously addressed. Extremities:  No cyanosis, clubbing  Neurologic exam : Balance,Rhomberg,finger to nose testing could not be completed due to clinical state Deep tendon reflexes are equal Skin: Warm & dry w/o tenting. No significant rash.  See clinical summary under each active problem in the Problem List with associated updated therapeutic plan

## 2016-05-14 NOTE — Assessment & Plan Note (Signed)
Basal insulin will be titrated to provide a fasting blood sugar less than 180. Pre meal insulin will be increased to 8 units as the glucoses are over 400. The oral prednisone chemotherapy is undoubtedly playing a role in the lack of control.

## 2016-05-14 NOTE — Assessment & Plan Note (Signed)
Creatinine apparently back to baseline Metformin contraindicated

## 2016-05-16 ENCOUNTER — Encounter (HOSPITAL_COMMUNITY): Payer: Self-pay | Admitting: Emergency Medicine

## 2016-05-16 ENCOUNTER — Emergency Department (HOSPITAL_COMMUNITY)
Admission: EM | Admit: 2016-05-16 | Discharge: 2016-05-16 | Disposition: A | Discharge status: Home / Self Care | Payer: Non-veteran care | Attending: Emergency Medicine | Admitting: Emergency Medicine

## 2016-05-16 ENCOUNTER — Emergency Department (HOSPITAL_COMMUNITY): Payer: Non-veteran care

## 2016-05-16 DIAGNOSIS — Z8546 Personal history of malignant neoplasm of prostate: Secondary | ICD-10-CM | POA: Insufficient documentation

## 2016-05-16 DIAGNOSIS — Z951 Presence of aortocoronary bypass graft: Secondary | ICD-10-CM | POA: Diagnosis not present

## 2016-05-16 DIAGNOSIS — Z794 Long term (current) use of insulin: Secondary | ICD-10-CM | POA: Diagnosis not present

## 2016-05-16 DIAGNOSIS — Z8673 Personal history of transient ischemic attack (TIA), and cerebral infarction without residual deficits: Secondary | ICD-10-CM | POA: Diagnosis not present

## 2016-05-16 DIAGNOSIS — Y999 Unspecified external cause status: Secondary | ICD-10-CM | POA: Insufficient documentation

## 2016-05-16 DIAGNOSIS — N183 Chronic kidney disease, stage 3 (moderate): Secondary | ICD-10-CM | POA: Insufficient documentation

## 2016-05-16 DIAGNOSIS — Y939 Activity, unspecified: Secondary | ICD-10-CM | POA: Insufficient documentation

## 2016-05-16 DIAGNOSIS — I509 Heart failure, unspecified: Secondary | ICD-10-CM | POA: Insufficient documentation

## 2016-05-16 DIAGNOSIS — Z79899 Other long term (current) drug therapy: Secondary | ICD-10-CM | POA: Diagnosis not present

## 2016-05-16 DIAGNOSIS — R531 Weakness: Secondary | ICD-10-CM | POA: Insufficient documentation

## 2016-05-16 DIAGNOSIS — I2510 Atherosclerotic heart disease of native coronary artery without angina pectoris: Secondary | ICD-10-CM | POA: Insufficient documentation

## 2016-05-16 DIAGNOSIS — E1122 Type 2 diabetes mellitus with diabetic chronic kidney disease: Secondary | ICD-10-CM | POA: Insufficient documentation

## 2016-05-16 DIAGNOSIS — Z7982 Long term (current) use of aspirin: Secondary | ICD-10-CM | POA: Insufficient documentation

## 2016-05-16 DIAGNOSIS — Y929 Unspecified place or not applicable: Secondary | ICD-10-CM | POA: Diagnosis not present

## 2016-05-16 DIAGNOSIS — R930 Abnormal findings on diagnostic imaging of skull and head, not elsewhere classified: Secondary | ICD-10-CM | POA: Insufficient documentation

## 2016-05-16 DIAGNOSIS — I130 Hypertensive heart and chronic kidney disease with heart failure and stage 1 through stage 4 chronic kidney disease, or unspecified chronic kidney disease: Secondary | ICD-10-CM | POA: Insufficient documentation

## 2016-05-16 DIAGNOSIS — W19XXXA Unspecified fall, initial encounter: Secondary | ICD-10-CM | POA: Diagnosis not present

## 2016-05-16 LAB — URINALYSIS, ROUTINE W REFLEX MICROSCOPIC
Bilirubin Urine: NEGATIVE
Hgb urine dipstick: NEGATIVE
KETONES UR: NEGATIVE mg/dL
LEUKOCYTES UA: NEGATIVE
Nitrite: NEGATIVE
PH: 5.5 (ref 5.0–8.0)
Protein, ur: 30 mg/dL — AB
Specific Gravity, Urine: 1.01 (ref 1.005–1.030)

## 2016-05-16 LAB — COMPREHENSIVE METABOLIC PANEL
ALBUMIN: 3.5 g/dL (ref 3.5–5.0)
ALT: 36 U/L (ref 17–63)
AST: 32 U/L (ref 15–41)
Alkaline Phosphatase: 96 U/L (ref 38–126)
Anion gap: 10 (ref 5–15)
BUN: 43 mg/dL — AB (ref 6–20)
CHLORIDE: 105 mmol/L (ref 101–111)
CO2: 22 mmol/L (ref 22–32)
Calcium: 9.6 mg/dL (ref 8.9–10.3)
Creatinine, Ser: 1.9 mg/dL — ABNORMAL HIGH (ref 0.61–1.24)
GFR calc Af Amer: 39 mL/min — ABNORMAL LOW (ref >60)
GFR, EST NON AFRICAN AMERICAN: 34 mL/min — AB (ref >60)
Glucose, Bld: 293 mg/dL — ABNORMAL HIGH (ref 65–99)
POTASSIUM: 4.3 mmol/L (ref 3.5–5.1)
SODIUM: 137 mmol/L (ref 135–145)
Total Bilirubin: 0.5 mg/dL (ref 0.3–1.2)
Total Protein: 6.9 g/dL (ref 6.5–8.1)

## 2016-05-16 LAB — PROTIME-INR
INR: 1
Prothrombin Time: 13.2 seconds (ref 11.4–15.2)

## 2016-05-16 LAB — URINALYSIS, MICROSCOPIC (REFLEX): WBC UA: NONE SEEN WBC/hpf (ref 0–5)

## 2016-05-16 LAB — CBC WITH DIFFERENTIAL/PLATELET
BASOS ABS: 0 10*3/uL (ref 0.0–0.1)
BASOS PCT: 0 %
EOS ABS: 0 10*3/uL (ref 0.0–0.7)
EOS PCT: 1 %
HCT: 35.5 % — ABNORMAL LOW (ref 39.0–52.0)
Hemoglobin: 11.7 g/dL — ABNORMAL LOW (ref 13.0–17.0)
Lymphocytes Relative: 12 %
Lymphs Abs: 0.7 10*3/uL (ref 0.7–4.0)
MCH: 29 pg (ref 26.0–34.0)
MCHC: 33 g/dL (ref 30.0–36.0)
MCV: 87.9 fL (ref 78.0–100.0)
MONO ABS: 0.4 10*3/uL (ref 0.1–1.0)
Monocytes Relative: 7 %
Neutro Abs: 4.2 10*3/uL (ref 1.7–7.7)
Neutrophils Relative %: 80 %
PLATELETS: 85 10*3/uL — AB (ref 150–400)
RBC: 4.04 MIL/uL — AB (ref 4.22–5.81)
RDW: 13.8 % (ref 11.5–15.5)
WBC: 5.3 10*3/uL (ref 4.0–10.5)

## 2016-05-16 LAB — I-STAT TROPONIN, ED: TROPONIN I, POC: 0.04 ng/mL (ref 0.00–0.08)

## 2016-05-16 LAB — RAPID URINE DRUG SCREEN, HOSP PERFORMED
AMPHETAMINES: NOT DETECTED
BARBITURATES: NOT DETECTED
BENZODIAZEPINES: NOT DETECTED
COCAINE: NOT DETECTED
Opiates: NOT DETECTED
Tetrahydrocannabinol: NOT DETECTED

## 2016-05-16 LAB — I-STAT CHEM 8, ED
BUN: 42 mg/dL — ABNORMAL HIGH (ref 6–20)
CALCIUM ION: 1.22 mmol/L (ref 1.15–1.40)
CREATININE: 1.9 mg/dL — AB (ref 0.61–1.24)
Chloride: 105 mmol/L (ref 101–111)
GLUCOSE: 280 mg/dL — AB (ref 65–99)
HCT: 36 % — ABNORMAL LOW (ref 39.0–52.0)
HEMOGLOBIN: 12.2 g/dL — AB (ref 13.0–17.0)
Potassium: 4.3 mmol/L (ref 3.5–5.1)
Sodium: 138 mmol/L (ref 135–145)
TCO2: 23 mmol/L (ref 0–100)

## 2016-05-16 LAB — I-STAT CG4 LACTIC ACID, ED: LACTIC ACID, VENOUS: 1.79 mmol/L (ref 0.5–1.9)

## 2016-05-16 LAB — LIPASE, BLOOD: LIPASE: 16 U/L (ref 11–51)

## 2016-05-16 LAB — ETHANOL: Alcohol, Ethyl (B): <5 mg/dL (ref <5)

## 2016-05-16 LAB — MAGNESIUM: Magnesium: 2.2 mg/dL (ref 1.7–2.4)

## 2016-05-16 MED ORDER — SODIUM CHLORIDE 0.9 % IV BOLUS (SEPSIS)
1000.0000 mL | Freq: Once | INTRAVENOUS | Status: AC
  Administered 2016-05-16: 1000 mL via INTRAVENOUS

## 2016-05-16 NOTE — ED Provider Notes (Signed)
[facility] DEPT Provider Note   CSN: [id number] Arrival date & time: 05/16/16  0022  By signing my name below, I, Evelene Croon, attest that this documentation has been prepared under the direction and in the presence of Everlene Balls, MD . Electronically Signed: Evelene Croon, Scribe. 05/16/2016. 12:34 AM.  History   Chief Complaint Chief Complaint  Patient presents with  . Fall  . Extremity Weakness    The history is provided by the patient. No language interpreter was used.     HPI Comments:  Collin Fox is a 70 y.o. male who presents to the Emergency Department s/p unwitnessed fall ~ 2 hours PTA complaining of generalized weakness. Pt reports subjective fever and vomiting. He denies diarrhea, cough, difficulty urinating and any pain throughout his body. No alleviating factors noted.  Pt has residual right sided weakness from past CVA.   Past Medical History:  Diagnosis Date  . Anemia   . Anxiety   . Atrial fibrillation ([place])   . CHF (congestive heart failure) ([place])   . Chronic kidney disease    "don't know what stage" (05/07/2016)  . Coronary artery disease   . Heart attack ~ 1994/1995  . High cholesterol   . Hypertension   . Prostate cancer ([place])   . Seizure-like activity ([place])    "right hand; takes seizure RX" (05/07/2016)  . Stroke [facility]) ~ 1994/1995   "took his speech; right side weaker since; cognitive skills impaired" (05/07/2016)  . Type II diabetes mellitus [facility])     Patient Active Problem List   Diagnosis Date Noted  . Prostate cancer ([place]) 05/14/2016  . DM (diabetes mellitus), secondary, uncontrolled, with peripheral vascular complications ([place]) [id number]  . Encephalopathy 05/07/2016  . Altered mental state 05/07/2016  . AKI (acute kidney injury) ([place])   . Stage 3 chronic kidney disease   . Paroxysmal atrial fibrillation (HCC)   . Coronary artery disease involving native coronary artery of native heart without angina pectoris   . Stroke ([place])  11/13/2014  . Essential hypertension 05/24/2012  . CAP (community acquired pneumonia) 05/23/2012  . Chronic congestive heart failure ([place]) 05/23/2012  . Renal insufficiency 05/23/2012  . Sepsis ([place]) 05/23/2012    Past Surgical History:  Procedure Laterality Date  . CARDIAC CATHETERIZATION    . CATARACT EXTRACTION, BILATERAL Bilateral   . CORONARY ARTERY BYPASS GRAFT  ~ 1994/1995  . PROSTATE BIOPSY         Home Medications    Prior to Admission medications   Medication Sig Start Date End Date Taking? Authorizing Provider  abiraterone Acetate (ZYTIGA) 250 MG tablet Take 1,000 mg by mouth every morning. Take on an empty stomach 1 hour before or 2 hours after a meal    Historical Provider, MD  amLODipine (NORVASC) 10 MG tablet Take 10 mg by mouth every morning.    Historical Provider, MD  ammonium lactate (LAC-HYDRIN) 12 % lotion Apply 1 application topically daily.    Historical Provider, MD  aspirin 81 MG chewable tablet Chew 81 mg by mouth every morning.    Historical Provider, MD  atorvastatin (LIPITOR) 80 MG tablet Take 80 mg by mouth daily at 6 PM.    Historical Provider, MD  brimonidine (ALPHAGAN) 0.2 % ophthalmic solution Place 1 drop into both eyes 3 (three) times daily.    Historical Provider, MD  carbamazepine (CARBATROL) 300 MG 12 hr capsule Take 600 mg by mouth 2 (two) times daily.     Historical Provider, MD  cloNIDine (  CATAPRES - DOSED IN MG/24 HR) 0.2 mg/24hr patch Place 1 patch onto the skin every 3 (three) days.     Historical Provider, MD  colchicine 0.6 MG tablet Take 0.6 mg by mouth as needed. For gout flare up    Historical Provider, MD  Diclofenac Sodium 1 % CREA Place 1 application onto the skin 4 (four) times daily as needed. To right large toe 08/06/15   Shawnee Knapp, MD  docusate sodium (COLACE) 100 MG capsule Take 100 mg by mouth 2 (two) times daily. For constipation    Historical Provider, MD  dorzolamide-timolol (COSOPT) 22.3-6.8 MG/ML ophthalmic solution  Place 1 drop into both eyes daily.    Historical Provider, MD  feeding supplement, GLUCERNA SHAKE, (GLUCERNA SHAKE) LIQD Take 237 mLs by mouth 3 (three) times daily between meals. 05/10/16   Steve Rattler, DO  hydrALAZINE (APRESOLINE) 100 MG tablet Take 100 mg by mouth 3 (three) times daily.    Historical Provider, MD  insulin aspart (NOVOLOG) 100 UNIT/ML injection Inject 5 Units into the skin 3 (three) times daily with meals.    Historical Provider, MD  insulin glargine (LANTUS) 100 UNIT/ML injection Inject 0.25 mLs (25 Units total) into the skin at bedtime. 05/10/16   Steve Rattler, DO  latanoprost (XALATAN) 0.005 % ophthalmic solution Place 1 drop into both eyes at bedtime.    Historical Provider, MD  leuprolide, 6 Month, (ELIGARD) 45 MG injection Inject 45 mg into the skin every 6 (six) months.    Historical Provider, MD  losartan (COZAAR) 100 MG tablet Take 100 mg by mouth every morning.    Historical Provider, MD  metoprolol (TOPROL-XL) 200 MG 24 hr tablet Take 200 mg by mouth every morning.    Historical Provider, MD  predniSONE (DELTASONE) 5 MG tablet Take 5 mg by mouth 2 (two) times daily with a meal.    Historical Provider, MD  senna (SENOKOT) 8.6 MG TABS Take 1 tablet by mouth daily. For constipation     Historical Provider, MD  spironolactone (ALDACTONE) 25 MG tablet Take 50 mg by mouth 2 (two) times daily.    Historical Provider, MD    Family History Family History  Problem Relation Age of Onset  . Diabetes Brother   . Heart disease Brother   . Hypertension Brother     Social History Social History  Substance Use Topics  . Smoking status: Never Smoker  . Smokeless tobacco: Never Used  . Alcohol use No     Allergies   Patient has no known allergies.   Review of Systems Review of Systems 10 systems reviewed and all are negative for acute change except as noted in the HPI.   Physical Exam Updated Vital Signs BP (!) 187/114   Pulse 95   Temp 97.6 F (36.4 C)  (Rectal)   Resp 16   Ht 6\' 1"  (1.854 m)   Wt 179 lb (81.2 kg)   SpO2 100%   BMI 23.62 kg/m   Physical Exam  Constitutional: He is oriented to person, place, and time. Vital signs are normal. He appears well-developed and well-nourished.  Non-toxic appearance. He does not appear ill. No distress.  HENT:  Head: Normocephalic and atraumatic.  Nose: Nose normal.  Mouth/Throat: Oropharynx is clear and moist. No oropharyngeal exudate.  Eyes: Conjunctivae and EOM are normal. Pupils are equal, round, and reactive to light. No scleral icterus.  Neck: Normal range of motion. Neck supple. No tracheal deviation, no edema, no erythema  and normal range of motion present. No thyroid mass and no thyromegaly present.  Cardiovascular: Normal rate, regular rhythm, S1 normal, S2 normal, normal heart sounds, intact distal pulses and normal pulses.  Exam reveals no gallop and no friction rub.   No murmur heard. Pulmonary/Chest: Effort normal and breath sounds normal. No respiratory distress. He has no wheezes. He has no rhonchi. He has no rales.  Abdominal: Soft. Normal appearance and bowel sounds are normal. He exhibits no distension, no ascites and no mass. There is no hepatosplenomegaly. There is no tenderness. There is no rebound, no guarding and no CVA tenderness.  Musculoskeletal: Normal range of motion. He exhibits no edema or tenderness.  Lymphadenopathy:    He has no cervical adenopathy.  Neurological: He is alert and oriented to person, place, and time. He has normal strength. No cranial nerve deficit or sensory deficit.  RUE has 3/5 strength  Nml strength on the left   Skin: Skin is warm, dry and intact. No petechiae and no rash noted. He is not diaphoretic. No erythema. No pallor.  Nursing note and vitals reviewed.    ED Treatments / Results  DIAGNOSTIC STUDIES:  Oxygen Saturation is 98% on RA, normal by my interpretation.    COORDINATION OF CARE:  12:34 AM Discussed treatment plan with  pt at bedside and pt agreed to plan.   Labs (all labs ordered are listed, but only abnormal results are displayed) Labs Reviewed  CBC WITH DIFFERENTIAL/PLATELET - Abnormal; Notable for the following:       Result Value   RBC 4.04 (*)    Hemoglobin 11.7 (*)    HCT 35.5 (*)    Platelets 85 (*)    All other components within normal limits  COMPREHENSIVE METABOLIC PANEL - Abnormal; Notable for the following:    Glucose, Bld 293 (*)    BUN 43 (*)    Creatinine, Ser 1.90 (*)    GFR calc non Af Amer 34 (*)    GFR calc Af Amer 39 (*)    All other components within normal limits  URINALYSIS, ROUTINE W REFLEX MICROSCOPIC - Abnormal; Notable for the following:    Glucose, UA >1000 (*)    Protein, ur 30 (*)    All other components within normal limits  URINALYSIS, MICROSCOPIC (REFLEX) - Abnormal; Notable for the following:    Bacteria, UA RARE (*)    Squamous Epithelial / LPF 0-5 (*)    All other components within normal limits  I-STAT CHEM 8, ED - Abnormal; Notable for the following:    BUN 42 (*)    Creatinine, Ser 1.90 (*)    Glucose, Bld 280 (*)    Hemoglobin 12.2 (*)    HCT 36.0 (*)    All other components within normal limits  LIPASE, BLOOD  MAGNESIUM  RAPID URINE DRUG SCREEN, HOSP PERFORMED  ETHANOL  PROTIME-INR  I-STAT TROPOININ, ED  I-STAT CG4 LACTIC ACID, ED  CBG MONITORING, ED    EKG  EKG Interpretation  Date/Time:  Thursday May 16 2016 00:22:45 EST Ventricular Rate:  94 PR Interval:    QRS Duration: 112 QT Interval:  396 QTC Calculation: 495 R Axis:   1 Text Interpretation:  Atrial fibrillation ST & T wave abnormality, consider lateral ischemia Abnormal ECG No significant change since last tracing Confirmed by Glynn Octave [phone]) on 05/16/2016 12:36:04 AM       Radiology Dg Chest 2 View  Result Date: 05/16/2016 CLINICAL DATA:  Fall, weakness  tonight. EXAM: CHEST  2 VIEW COMPARISON:  Radiographs 05/07/2016 FINDINGS: Patient is post median  sternotomy and CABG. There is stable cardiomegaly. Lung volumes are low. No pulmonary edema, pleural fluid, pneumothorax or focal airspace disease. No acute osseous abnormality is seen. IMPRESSION: Low lung volumes.  Stable cardiomegaly. No acute abnormality. Electronically Signed   By: Jeb Levering M.D.   On: 05/16/2016 01:24   Ct Head Wo Contrast  Result Date: 05/16/2016 CLINICAL DATA:  Fall, weakness. History of chronic kidney disease, diabetes, prostate cancer, hypertension, seizures. EXAM: CT HEAD WITHOUT CONTRAST CT CERVICAL SPINE WITHOUT CONTRAST TECHNIQUE: Multidetector CT imaging of the head and cervical spine was performed following the standard protocol without intravenous contrast. Multiplanar CT image reconstructions of the cervical spine were also generated. COMPARISON:  MRI head May 07, 2016 FINDINGS: CT HEAD FINDINGS BRAIN: No intraparenchymal hemorrhage, mass effect, midline shift or acute large vascular territory infarcts. Confluent LEFT frontotemporal parietal encephalomalacia with mild ex vacuo dilatation LEFT lateral ventricle, no hydrocephalus. RIGHT mesial frontal lobe encephalomalacia. Old thalamus and cerebellar infarcts better seen on prior MRI. No abnormal extra-axial fluid collections. VASCULAR: Severe calcific atherosclerosis of the carotid siphons. SKULL: No skull fracture. No significant scalp soft tissue swelling. SINUSES/ORBITS: The mastoid air-cells and included paranasal sinuses are well-aerated. Status post RIGHT ocular lens implant with glaucoma drainage device.The included ocular globes and orbital contents are non-suspicious. OTHER:  Left preauricular tiny metallic foreign body. CT CERVICAL SPINE FINDINGS ALIGNMENT: Straightened cervical lordosis.  No malalignment. SKULL BASE AND VERTEBRAE: Cervical vertebral bodies and posterior elements are intact. Severe C5-6 disc height loss, endplate spurring compatible with degenerative disc, moderate at C4-5. No destructive  bony lesions. C1-2 articulation maintained. SOFT TISSUES AND SPINAL CANAL: Nonacute. Moderate calcific atherosclerosis of the carotid bifurcations. DISC LEVELS: No significant osseous canal stenosis or neural foraminal narrowing. UPPER CHEST: Lung apices are clear. OTHER: None. IMPRESSION: CT HEAD: No acute intracranial process. Stable examination including old large LEFT MCA and small RIGHT ACA territory infarcts. Severe atherosclerosis. CT CERVICAL SPINE: No acute fracture or malalignment. Electronically Signed   By: Elon Alas M.D.   On: 05/16/2016 01:43   Ct Cervical Spine Wo Contrast  Result Date: 05/16/2016 CLINICAL DATA:  Fall, weakness. History of chronic kidney disease, diabetes, prostate cancer, hypertension, seizures. EXAM: CT HEAD WITHOUT CONTRAST CT CERVICAL SPINE WITHOUT CONTRAST TECHNIQUE: Multidetector CT imaging of the head and cervical spine was performed following the standard protocol without intravenous contrast. Multiplanar CT image reconstructions of the cervical spine were also generated. COMPARISON:  MRI head May 07, 2016 FINDINGS: CT HEAD FINDINGS BRAIN: No intraparenchymal hemorrhage, mass effect, midline shift or acute large vascular territory infarcts. Confluent LEFT frontotemporal parietal encephalomalacia with mild ex vacuo dilatation LEFT lateral ventricle, no hydrocephalus. RIGHT mesial frontal lobe encephalomalacia. Old thalamus and cerebellar infarcts better seen on prior MRI. No abnormal extra-axial fluid collections. VASCULAR: Severe calcific atherosclerosis of the carotid siphons. SKULL: No skull fracture. No significant scalp soft tissue swelling. SINUSES/ORBITS: The mastoid air-cells and included paranasal sinuses are well-aerated. Status post RIGHT ocular lens implant with glaucoma drainage device.The included ocular globes and orbital contents are non-suspicious. OTHER:  Left preauricular tiny metallic foreign body. CT CERVICAL SPINE FINDINGS ALIGNMENT:  Straightened cervical lordosis.  No malalignment. SKULL BASE AND VERTEBRAE: Cervical vertebral bodies and posterior elements are intact. Severe C5-6 disc height loss, endplate spurring compatible with degenerative disc, moderate at C4-5. No destructive bony lesions. C1-2 articulation maintained. SOFT TISSUES AND SPINAL CANAL: Nonacute. Moderate calcific  atherosclerosis of the carotid bifurcations. DISC LEVELS: No significant osseous canal stenosis or neural foraminal narrowing. UPPER CHEST: Lung apices are clear. OTHER: None. IMPRESSION: CT HEAD: No acute intracranial process. Stable examination including old large LEFT MCA and small RIGHT ACA territory infarcts. Severe atherosclerosis. CT CERVICAL SPINE: No acute fracture or malalignment. Electronically Signed   By: Elon Alas M.D.   On: 05/16/2016 01:43    Procedures Procedures (including critical care time)  Medications Ordered in ED Medications  sodium chloride 0.9 % bolus 1,000 mL (1,000 mLs Intravenous New Bag/Given 05/16/16 0217)     Initial Impression / Assessment and Plan / ED Course  I have reviewed the triage vital signs and the nursing notes.  Pertinent labs & imaging results that were available during my care of the patient were reviewed by me and considered in my medical decision making (see chart for details).  Clinical Course    Patient presents to the ED for unwitnessed fall, and subsequent N/V x 3.  He currently denies pain but states he has been weak and has been having fevers recently.  Will obtain infectious work up, CT head, and labs for further evaluation.   3:24 AM Labs and CT head are unremarkable.  He continues to appear well and in NAD.  Vs remain within his normal limits and he is safe for DC.    Final Clinical Impressions(s) / ED Diagnoses   Final diagnoses:  Fall, initial encounter    New Prescriptions New Prescriptions   No medications on file   I personally performed the services described in  this documentation, which was scribed in my presence. The recorded information has been reviewed and is accurate.      Everlene Balls, MD 05/16/16 [phone]

## 2016-05-16 NOTE — ED Notes (Signed)
Patient transported to X-ray 

## 2016-05-16 NOTE — ED Triage Notes (Signed)
Pt from [facility] via [facility]. Per nurse @ facility, pt had an unwitnessed fall around 2200, he was aided back to bed. Denied LOC. Nurse called EMS due to pt having n/v around 2345. Pt denies pain @ this time. C/o generalized weakness. Pt has hx of stroke w/ R sided deficits and slurred speech per baseline. A&O x3 except date.

## 2016-05-16 NOTE — ED Notes (Signed)
IVT @ bedside

## 2016-06-05 ENCOUNTER — Non-Acute Institutional Stay (SKILLED_NURSING_FACILITY): Payer: Self-pay | Admitting: Nurse Practitioner

## 2016-06-05 DIAGNOSIS — R197 Diarrhea, unspecified: Secondary | ICD-10-CM

## 2016-06-05 DIAGNOSIS — A09 Infectious gastroenteritis and colitis, unspecified: Secondary | ICD-10-CM

## 2016-06-05 NOTE — Progress Notes (Signed)
Nursing Home Location:  [facility] Room number: 215  Place of Service: SNF (31)  PCP: [facility]  No Known Allergies  Chief Complaint  Patient presents with  . Acute Visit    Stomach pain    HPI:  Patient is a 70 y.o. male seen today at [facility] at the request of nursing for stomach pains x 2 days. 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 Wife at bedside and reports pt has had decreased appetite but still drinking well. Pt reports he has had loose stools today x 3 episodes with nausea but no vomiting. Denies stomach ache or abdominal pain. Reports his bowels are upset because he has had diarrhea. Wife reports he started complaining of upset stomach on christmas.  No fevers or chills. Pt also with hx of chronic constipation and on multiple laxative/softeners for his bowels.  Several other residents in facility have had gastroenteritis over the past week. Review of Systems:  Review of Systems  Constitutional: Positive for appetite change. Negative for fatigue, fever and unexpected weight change.  HENT: Negative for mouth sores.   Respiratory: Negative for shortness of breath.   Cardiovascular: Negative for chest pain and leg swelling.  Gastrointestinal: Positive for diarrhea and nausea. Negative for abdominal distention, abdominal pain, anal bleeding, blood in stool, constipation, rectal pain and vomiting.    Past Medical History:  Diagnosis Date  . Anemia   . Anxiety   . Atrial fibrillation ([place])   . CHF (congestive heart failure) ([place])   . Chronic kidney disease    "don't know what stage" (05/07/2016)  . Coronary artery disease   . Heart attack ~ 1994/1995  . High cholesterol   . Hypertension   . Prostate cancer ([place])   . Seizure-like activity ([place])    "right hand; takes seizure RX" (05/07/2016)  . Stroke [facility]) ~ 1994/1995   "took his  speech; right side weaker since; cognitive skills impaired" (05/07/2016)  . Type II diabetes mellitus ([place])    Past Surgical History:  Procedure Laterality Date  . CARDIAC CATHETERIZATION    . CATARACT EXTRACTION, BILATERAL Bilateral   . CORONARY ARTERY BYPASS GRAFT  ~ 1994/1995  . PROSTATE BIOPSY     Social History:   reports that he has never smoked. He has never used smokeless tobacco. He reports that he does not drink alcohol or use drugs.  Family History  Problem Relation Age of Onset  . Diabetes Brother   . Heart disease Brother   . Hypertension Brother     Medications: Patient's Medications  New Prescriptions   No medications on file  Previous Medications   ABIRATERONE ACETATE (ZYTIGA) 250 MG TABLET    Take 1,000 mg by mouth every morning. Take on an empty stomach 1 hour before or 2 hours after a meal   AMLODIPINE (NORVASC) 10 MG TABLET    Take 10 mg by mouth every morning.   AMMONIUM LACTATE (LAC-HYDRIN) 12 % LOTION    Apply 1 application topically daily.   ASPIRIN 81 MG CHEWABLE TABLET    Chew 81 mg by mouth every morning.   ATORVASTATIN (LIPITOR) 80 MG TABLET    Take 80 mg by mouth daily at 6 PM.   BRIMONIDINE (ALPHAGAN) 0.2 % OPHTHALMIC SOLUTION    Place 1 drop into both eyes 3 (three) times daily.   CARBAMAZEPINE (CARBATROL)  300 MG 12 HR CAPSULE    Take 600 mg by mouth 2 (two) times daily.    CLONIDINE (CATAPRES) 0.1 MG TABLET    Take 0.1 mg by mouth 2 (two) times daily.   COLCHICINE 0.6 MG TABLET    Take 0.6 mg by mouth as needed. For gout flare up   DICLOFENAC SODIUM 1 % CREA    Place 1 application onto the skin 4 (four) times daily as needed. To right large toe   DOCUSATE SODIUM (COLACE) 100 MG CAPSULE    Take 100 mg by mouth 2 (two) times daily. For constipation   DORZOLAMIDE-TIMOLOL (COSOPT) 22.3-6.8 MG/ML OPHTHALMIC SOLUTION    Place 1 drop into both eyes daily.   HYDRALAZINE (APRESOLINE) 100 MG TABLET    Take 100 mg by mouth 3 (three) times daily.   INSULIN  ASPART (NOVOLOG) 100 UNIT/ML INJECTION    Inject 8 Units into the skin 3 (three) times daily with meals.    INSULIN GLARGINE (LANTUS) 100 UNIT/ML INJECTION    Inject 35 Units into the skin at bedtime.   LATANOPROST (XALATAN) 0.005 % OPHTHALMIC SOLUTION    Place 1 drop into both eyes at bedtime.   LEUPROLIDE, 6 MONTH, (ELIGARD) 45 MG INJECTION    Inject 45 mg into the skin every 6 (six) months.   LOSARTAN (COZAAR) 100 MG TABLET    Take 100 mg by mouth every morning.   METOPROLOL (TOPROL-XL) 200 MG 24 HR TABLET    Take 200 mg by mouth every morning.   PREDNISONE (DELTASONE) 5 MG TABLET    Take 5mg  by mouth daily until 06/13/16, then take 2.5 mg by mouth daily until 06/27/16, then stop.   SENNA (SENOKOT) 8.6 MG TABS    Take 1 tablet by mouth daily. For constipation    SPIRONOLACTONE (ALDACTONE) 25 MG TABLET    Take 50 mg by mouth 2 (two) times daily.  Modified Medications   No medications on file  Discontinued Medications   CLONIDINE (CATAPRES - DOSED IN MG/24 HR) 0.2 MG/24HR PATCH    Place 1 patch onto the skin every 3 (three) days.    FEEDING SUPPLEMENT, GLUCERNA SHAKE, (GLUCERNA SHAKE) LIQD    Take 237 mLs by mouth 3 (three) times daily between meals.   INSULIN GLARGINE (LANTUS) 100 UNIT/ML INJECTION    Inject 0.25 mLs (25 Units total) into the skin at bedtime.   PREDNISONE (DELTASONE) 5 MG TABLET    Take 5 mg by mouth 2 (two) times daily with a meal.     Physical Exam: Vitals:   06/05/16 1414  BP: 132/70  Pulse: 68  Resp: 16  Temp: 97.6 F (36.4 C)  SpO2: 100%  Weight: 179 lb (81.2 kg)  Height: 6\' 1"  (1.854 m)    Physical Exam  Constitutional: He appears well-developed and well-nourished.  Cardiovascular: Normal rate, regular rhythm and normal heart sounds.   Pulmonary/Chest: Effort normal and breath sounds normal.  Abdominal: Soft. Bowel sounds are normal. He exhibits no distension and no mass. There is no tenderness. There is no rebound and no guarding.  Skin: Skin is warm and  dry.    Labs reviewed: Basic Metabolic Panel:  Recent Labs  05/08/16 0103 05/09/16 0348 05/16/16 0046 05/16/16 0054  NA 141 140 137 138  K 3.5 3.8 4.3 4.3  CL 108 108 105 105  CO2 24 24 22   --   GLUCOSE 175* 215* 293* 280*  BUN 62* 48* 43* 42*  CREATININE 2.47*  2.05* 1.90* 1.90*  CALCIUM 9.8 9.1 9.6  --   MG  --   --  2.2  --    Liver Function Tests:  Recent Labs  01/21/16 1929 05/07/16 1211 05/16/16 0046  AST 29 28 32  ALT 28 33 36  ALKPHOS 114 92 96  BILITOT 0.5 0.7 0.5  PROT 8.6* 7.1 6.9  ALBUMIN 4.4 3.7 3.5    Recent Labs  01/21/16 1929 05/16/16 0046  LIPASE 19 16    Recent Labs  05/07/16 1211  AMMONIA 38*   CBC:  Recent Labs  01/21/16 1929 05/07/16 1211  05/09/16 0348 05/10/16 0819 05/16/16 0046 05/16/16 0054  WBC 5.3 5.1  < > 5.4 5.4 5.3  --   NEUTROABS 4.3 4.3  --   --   --  4.2  --   HGB 11.3* 12.2*  < > 11.5* 11.5* 11.7* 12.2*  HCT 34.9* 37.2*  < > 35.8* 34.7* 35.5* 36.0*  MCV 88.4 86.3  < > 88.6 86.8 87.9  --   PLT PLATELET CLUMPS NOTED ON SMEAR, UNABLE TO ESTIMATE 177  < > 164 136* 85*  --   < > = values in this interval not displayed. TSH:  Recent Labs  05/07/16 1838  TSH 0.467   A1C: Lab Results  Component Value Date   HGBA1C 7.8 (H) 05/23/2012   Lipid Panel: No results for input(s): CHOL, HDL, LDLCALC, TRIG, CHOLHDL, LDLDIRECT in the last 8760 hours.  Assessment/Plan 1. Diarrhea of presumed infectious origin gastroenteritis going around facility which could be cause of diarrhea  Staff to provide Supportive care Encourage hydration Cbc, bmp to evaluate for dehydration/infection florastore PO BID for 1 week zofran 4 mg po q 8 hours PRN nausea  Hold senna and colace for diarrhea.   Carlos American. Harle Battiest  [facility] [phone] 8 am - 5 pm) [phone] (after hours)

## 2016-06-07 LAB — CBC AND DIFFERENTIAL
HEMATOCRIT: 30 % — AB (ref 41–53)
Hemoglobin: 9.3 g/dL — AB (ref 13.5–17.5)
PLATELETS: 143 10*3/uL — AB (ref 150–399)
WBC: 3.8 10*3/mL

## 2016-06-07 LAB — BASIC METABOLIC PANEL
BUN: 30 mg/dL — AB (ref 4–21)
CREATININE: 1.5 mg/dL — AB (ref 0.6–1.3)
GLUCOSE: 145 mg/dL
Potassium: 4.2 mmol/L (ref 3.4–5.3)
Sodium: 141 mmol/L (ref 137–147)

## 2016-06-11 ENCOUNTER — Encounter: Payer: Self-pay | Admitting: Internal Medicine

## 2016-06-11 ENCOUNTER — Non-Acute Institutional Stay (SKILLED_NURSING_FACILITY): Payer: Self-pay | Admitting: Internal Medicine

## 2016-06-11 DIAGNOSIS — R059 Cough, unspecified: Secondary | ICD-10-CM

## 2016-06-11 DIAGNOSIS — R05 Cough: Secondary | ICD-10-CM

## 2016-06-11 DIAGNOSIS — R627 Adult failure to thrive: Secondary | ICD-10-CM

## 2016-06-11 DIAGNOSIS — D649 Anemia, unspecified: Secondary | ICD-10-CM | POA: Insufficient documentation

## 2016-06-11 DIAGNOSIS — N179 Acute kidney failure, unspecified: Secondary | ICD-10-CM

## 2016-06-11 NOTE — Assessment & Plan Note (Signed)
Aspiration pneumonia , UTI or possibly recurrent stroke must be ruled out.

## 2016-06-11 NOTE — Progress Notes (Signed)
Facility Location: [facility]  Room Number: [id number]  Code Status: Full Code   PCP: [facility] [id number] [place] [id number]  This is a nursing facility follow up for specific acute issue of adult failure to thrive.  Interim medical record and care since last [place] visit was updated with review of diagnostic studies and change in clinical status since last visit were documented.  HPI: His wife inquired as to why he is not taking Lasix which had been prescribed by the [facility]. It was reported to me by staff that it was discontinued in the hospital due to clinical assessment of euvolemia and because he was dehydrated upon admission.. It was not restarted on his admission to this facility.Spironolactone 50 mg twice a day has been continued. He had been hospitalized with acute mental status changes and acute kidney insufficiency in the context of history of stroke with residual dysarthria. His renal insufficiency had stabilized with a value of 1.9. He's exhibited significant hyperglycemia, insulin doses have been increased. There is no A1c in Epic since 2013.  He was seen 06/05/16 for abdominal pains over 48 hours.Anemia was progressively improving until 12/29 when hemoglobin was 9.3 & hematocrit 30.2. On 12/7 hemoglobin was 12.2 and hematocrit 36. While in the hospital he was noted have a bowel movement with bright red blood in it. Fecal occult blood testing was negative as was rectal exam. The rectal bleeding was attributed to chronic constipation with hard stools. Colonoscopy had been proposed by the [place] but could not be completed due to his poor candidacy for anesthesia. He had loose stools 3 on multiple laxatives/softeners for the chronic constipation. Also several SNF residents have had diarrhea suggesting viral gastroenteritis. Family stated that appetite was decreased but he was drinking well.    Florastor was ordered for one week. Zofran was prescribed for nausea. His senna and Colace were held because of the loose stool.  He was reported as pocketing his food since 06/10/16. Also PT/OT staff have noted decreased strength on the right side. Apparently he's had a cough since Christmas according to his wife. Last evening she found the blood linens soaked in urine.  He does have history of active prostate cancer.   PT/OT staff interviewed his daughter who said this generalized weakness  is a typical picture when he becomes ill.  [id number], hemoptysis, hematuria, melena, or rectal bleeding denied @ SNF. No unexplained weight loss, significant dyspepsia  There is no abnormal bruising , bleeding, or difficulty stopping bleeding with injury.  Physical exam:  Pertinent or positive findings: patient has dysarthria. Answers are monosyllabic. Ptosis greater on the right than the left. There is facial asymmetry with decreased right nasolabial fold. He has pattern alopecia. He also has a beard and mustache. Rhythm is irregular. Breath sounds are decreased. He has mild rhonchi. There is asymmetric weakness greatest in the right lower and right upper extremities. Range of motion is decreased on the right as well. Great toenails are thickened. He has clubbing of the nailbeds. There is a well-healed scar and tattoo over the left deltoid area.. Some pitting edema despite TED hose.  General appearance:Adequately nourished; no acute distress , increased work of breathing is present.   Lymphatic: No lymphadenopathy about the head, neck, axilla . Eyes: No conjunctival inflammation or lid edema is present. There is no scleral icterus. Ears:  External ear exam shows no significant lesions or deformities.   Nose:  External  nasal examination shows no deformity or inflammation. Nasal mucosa are pink and moist without lesions ,exudates Oral exam: lips and gums are healthy appearing.There is no oropharyngeal  erythema or exudate . Neck:  No thyromegaly, masses, tenderness noted.    Heart:  No gallop, murmur, click, rub .  Abdomen:Bowel sounds are normal. Abdomen is soft and nontender with no organomegaly, hernias,masses. GU: deferred  Extremities:  No cyanosis  Balance,Rhomberg,finger to nose testing could not be completed due to clinical state Skin: Warm & dry w/o tenting. No significant lesions or rash.    See summary under each active problem in the Problem List with associated updated therapeutic plan

## 2016-06-11 NOTE — Patient Instructions (Signed)
See Current Assessment & Plan in Problem List under specific Diagnosis 

## 2016-06-11 NOTE — Assessment & Plan Note (Signed)
Monitor CBC and differential FOBT on stools daily 3

## 2016-06-11 NOTE — Assessment & Plan Note (Signed)
Creatinine has improved and is now on 0.48. Lasix will be reinitiated at low-dose. Spironolactone dose will need to be adjusted if renal insufficiency progresses

## 2016-06-12 LAB — CBC AND DIFFERENTIAL
HCT: 33 % — AB (ref 41–53)
HEMOGLOBIN: 10.2 g/dL — AB (ref 13.5–17.5)
Platelets: 163 10*3/uL (ref 150–399)
WBC: 4.2 10^3/mL

## 2016-06-13 ENCOUNTER — Encounter: Payer: Self-pay | Admitting: Internal Medicine

## 2016-06-13 ENCOUNTER — Encounter: Payer: Self-pay | Admitting: *Deleted

## 2016-06-13 ENCOUNTER — Non-Acute Institutional Stay (SKILLED_NURSING_FACILITY): Payer: Self-pay | Admitting: Internal Medicine

## 2016-06-13 DIAGNOSIS — K5909 Other constipation: Secondary | ICD-10-CM | POA: Insufficient documentation

## 2016-06-13 DIAGNOSIS — R627 Adult failure to thrive: Secondary | ICD-10-CM

## 2016-06-13 LAB — BASIC METABOLIC PANEL
BUN: 34 mg/dL — AB (ref 4–21)
CREATININE: 1.9 mg/dL — AB (ref 0.6–1.3)
Glucose: 243 mg/dL
POTASSIUM: 3.3 mmol/L — AB (ref 3.4–5.3)
Sodium: 140 mmol/L (ref 137–147)

## 2016-06-13 LAB — HEPATIC FUNCTION PANEL
ALK PHOS: 92 U/L (ref 25–125)
ALT: 28 U/L (ref 10–40)
AST: 35 U/L (ref 14–40)
Bilirubin, Total: 0.4 mg/dL

## 2016-06-13 NOTE — Assessment & Plan Note (Signed)
CMET pending Urinalysis does not suggest urinary tract infection but C&S is pending Dramatic improvement in neuromuscular & mental status; the etiology of these changes is clinically unclear

## 2016-06-13 NOTE — Patient Instructions (Signed)
See Current Assessment & Plan  

## 2016-06-13 NOTE — Assessment & Plan Note (Signed)
As per protocol

## 2016-06-13 NOTE — Progress Notes (Signed)
   [facility] Room: [place] [address]  This is a nursing facility follow up of status and neuromuscular changes as described in the note 06/11/16.  Interim medical record and care since last [place] visit was updated with review of diagnostic studies and change in clinical status since last visit were documented.  HPI:The on-call provider last night requested nasal swab for Influenza A & B and Norovirus on the stools. Normal saline IV was ordered. The exact reasons for these orders is unclear He had exhibited anemia which was progressive. Pending are fecal occult blood testing of stools daily 3. Today he tells me he is constipated. Because of the mental status changes and dysarthria there was concern that he may be aspirating. Also possible urinary tract infection as etiology of the changes was considered. Urinalysis is essentially negative. Pending at the time of this dictation is a CMET .The results were requested by phone today, but still not available.   Review of systemsDysarthria hindered review of systems. His only complaint appears to be constipation. No fever,significant weight change, fatigue  Eyes: No redness, discharge, pain, vision change ENT/mouth: No nasal congestion,  purulent discharge, earache,change in hearing ,sore throat  Cardiovascular: No chest pain, palpitations,paroxysmal nocturnal dyspnea, claudication, edema  Respiratory: No cough, sputum production,hemoptysis, DOE  Gastrointestinal: No heartburn,dysphagia,abdominal pain, nausea / vomiting,rectal bleeding, melena Genitourinary: No dysuria,hematuria, pyuria,  incontinence, nocturia Musculoskeletal: No joint stiffness, joint swelling, pain Dermatologic: No rash, pruritus, change in appearance of skin Hematologic/lymphatic: No significant bruising, lymphadenopathy,abnormal bleeding   Physical exam:  Pertinent or  positive findings: pattern alopecia, beard, mustache are present. The dysarthria persists, but there's been a dramatic improvement in his mental status. He was up in the wheelchair and has improved movement in the extremities. He continues to have asymmetric limb weakness, but it is better than on 1/2. Weakness is most marked in the right lower extremity.  He is interactive & animated, the marked lethargy previously exhibited has resolved.  He has an occasional premature beat. Pedal pulses are decreased. He has trace pedal edema. There is a very large hemangioma over the back. This could not be visualized on 1/2 as he could not sit up at that time.  General appearance:Adequately nourished; no acute distress , increased work of breathing is present.   Lymphatic: No lymphadenopathy about the head, neck, axilla . Eyes: No conjunctival inflammation or lid edema is present. There is no scleral icterus. Ears:  External ear exam shows no significant lesions or deformities.   Nose:  External nasal examination shows no deformity or inflammation. Nasal mucosa are pink and moist without lesions ,exudates Oral exam: lips and gums are healthy appearing.There is no oropharyngeal erythema or exudate . Neck:  No thyromegaly, masses, tenderness noted.    Heart:  No gallop, murmur, click, rub .  Lungs:Chest clear to auscultation without wheezes, rhonchi,rales , rubs. Abdomen:Bowel sounds are normal. Abdomen is soft and nontender with no organomegaly, hernias,masses. GU: deferred  Extremities:  No cyanosis, clubbing Neurologic exam : Balance,Rhomberg,finger to nose testing could not be completed due to clinical state Skin: Warm & dry w/o tenting. No significant lesions or rash.   #1 dramatic improvement in mental status and neuromuscular function albeit with residual deficits related to his previous stroke #2 constipation Plan: No change in meds pending return of labs Constipation protocol

## 2016-06-28 ENCOUNTER — Encounter: Payer: Self-pay | Admitting: Nurse Practitioner

## 2016-06-28 ENCOUNTER — Non-Acute Institutional Stay (SKILLED_NURSING_FACILITY): Payer: Self-pay | Admitting: Nurse Practitioner

## 2016-06-28 DIAGNOSIS — R4182 Altered mental status, unspecified: Secondary | ICD-10-CM

## 2016-06-28 DIAGNOSIS — E1365 Other specified diabetes mellitus with hyperglycemia: Secondary | ICD-10-CM

## 2016-06-28 DIAGNOSIS — C61 Malignant neoplasm of prostate: Secondary | ICD-10-CM

## 2016-06-28 DIAGNOSIS — IMO0002 Reserved for concepts with insufficient information to code with codable children: Secondary | ICD-10-CM

## 2016-06-28 DIAGNOSIS — I509 Heart failure, unspecified: Secondary | ICD-10-CM

## 2016-06-28 DIAGNOSIS — E1351 Other specified diabetes mellitus with diabetic peripheral angiopathy without gangrene: Secondary | ICD-10-CM

## 2016-06-28 DIAGNOSIS — I480 Paroxysmal atrial fibrillation: Secondary | ICD-10-CM

## 2016-06-28 DIAGNOSIS — I10 Essential (primary) hypertension: Secondary | ICD-10-CM

## 2016-06-28 NOTE — Progress Notes (Signed)
Nursing Home Location:  [facility] Room number: 215 A  Place of Service: SNF (31)  PCP: [facility]  No Known Allergies  Chief Complaint  Patient presents with  . Discharge Note    HPI:  Patient is a 71 y.o. male seen today at [facility] for discharge home with care in place. 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 Pt is at [facility] after hospitalization from 11/28-12/1/17 with altered mental status in the context of acute renal insufficiency superimposed on chronic renal disease. He thought he was hospitalized with stroke, but the MRI did not reveal new vascular event. The patient had a stroke in the 1990s and has residual dysarthria. He lives with his wife who is his primary caregiver. While in hospital blood was noted on bowel movement. FOBT and rectal exam were negative. Pt had N/V/diarrhea while at [facility] but this has resolved.  Review of Systems:  Review of Systems  Constitutional: Negative for appetite change, fatigue, fever and unexpected weight change.  HENT: Negative for mouth sores.   Respiratory: Negative for shortness of breath.   Cardiovascular: Negative for chest pain and leg swelling.  Gastrointestinal: Negative for abdominal distention, abdominal pain, anal bleeding, blood in stool, constipation, diarrhea, nausea, rectal pain and vomiting.  Musculoskeletal: Negative for arthralgias.  Skin: Negative for wound.  Psychiatric/Behavioral: Positive for confusion.    Past Medical History:  Diagnosis Date  . Anemia   . Anxiety   . Atrial fibrillation ([place])   . CHF (congestive heart failure) ([place])   . Chronic kidney disease    "don't know what stage" (05/07/2016)  . Coronary artery disease   . Heart attack ~ 1994/1995  . High cholesterol   . Hypertension   . Prostate cancer ([place])   . Seizure-like activity ([place])    "right hand;  takes seizure RX" (05/07/2016)  . Stroke [facility]) ~ 1994/1995   "took his speech; right side weaker since; cognitive skills impaired" (05/07/2016)  . Type II diabetes mellitus ([place])    Past Surgical History:  Procedure Laterality Date  . CARDIAC CATHETERIZATION    . CATARACT EXTRACTION, BILATERAL Bilateral   . CORONARY ARTERY BYPASS GRAFT  ~ 1994/1995  . PROSTATE BIOPSY     Social History:   reports that he has never smoked. He has never used smokeless tobacco. He reports that he does not drink alcohol or use drugs.  Family History  Problem Relation Age of Onset  . Diabetes Brother   . Heart disease Brother   . Hypertension Brother     Medications: Patient's Medications  New Prescriptions   No medications on file  Previous Medications   ABIRATERONE ACETATE (ZYTIGA) 250 MG TABLET    Take 1,000 mg by mouth every morning. Take on an empty stomach 1 hour before or 2 hours after a meal   AMLODIPINE (NORVASC) 10 MG TABLET    Take 10 mg by mouth every morning.   AMMONIUM LACTATE (LAC-HYDRIN) 12 % LOTION    Apply 1 application topically daily.   ASPIRIN 81 MG CHEWABLE TABLET    Chew 81 mg by mouth every morning.   ATORVASTATIN (LIPITOR) 80 MG TABLET    Take 80 mg by mouth daily at 6 PM.   BRIMONIDINE (ALPHAGAN) 0.2 % OPHTHALMIC SOLUTION    Place 1 drop into both eyes 3 (three) times daily.  CARBAMAZEPINE (CARBATROL) 300 MG 12 HR CAPSULE    Take 600 mg by mouth 2 (two) times daily.    CLONIDINE (CATAPRES) 0.1 MG TABLET    Take 0.1 mg by mouth daily.    COLCHICINE 0.6 MG TABLET    Give two capsules by mouth daily as needed for gout flare. Repeat one capsule in one hour as needed. Do not repeat for at least a week.   DICLOFENAC SODIUM 1 % CREA    Place 1 application onto the skin 4 (four) times daily as needed. To right large toe   DOCUSATE SODIUM (COLACE) 100 MG CAPSULE    Take 100 mg by mouth 2 (two) times daily. For constipation   DORZOLAMIDE-TIMOLOL (COSOPT) 22.3-6.8 MG/ML OPHTHALMIC  SOLUTION    Place 1 drop into both eyes daily.   FUROSEMIDE (LASIX) 20 MG TABLET    Take 20 mg by mouth daily.   HYDRALAZINE (APRESOLINE) 100 MG TABLET    Take 100 mg by mouth 3 (three) times daily.   INSULIN ASPART (NOVOLOG) 100 UNIT/ML INJECTION    Inject 8 Units into the skin 3 (three) times daily with meals.    INSULIN GLARGINE (LANTUS) 100 UNIT/ML INJECTION    Inject 35 Units into the skin at bedtime.   LATANOPROST (XALATAN) 0.005 % OPHTHALMIC SOLUTION    Place 1 drop into both eyes at bedtime.   LEUPROLIDE, 6 MONTH, (ELIGARD) 45 MG INJECTION    Inject 45 mg into the skin every 6 (six) months.   LOSARTAN (COZAAR) 100 MG TABLET    Take 100 mg by mouth every morning.   METOPROLOL (TOPROL-XL) 200 MG 24 HR TABLET    Take 200 mg by mouth every morning.   SENNA (SENOKOT) 8.6 MG TABLET    Take 1 tablet by mouth daily.   SPIRONOLACTONE (ALDACTONE) 25 MG TABLET    Take 50 mg by mouth 2 (two) times daily.   UNABLE TO FIND    Med Name: Med Pass 120 mL daily  Modified Medications   No medications on file  Discontinued Medications   PREDNISONE (DELTASONE) 5 MG TABLET    Take 5mg  by mouth daily until 06/13/16, then take 2.5 mg by mouth daily until 06/27/16, then stop.     Physical Exam: Vitals:   06/28/16 1113  BP: 138/71  Pulse: 84  Resp: 20  Temp: 97.8 F (36.6 C)  SpO2: 95%  Weight: 196 lb (88.9 kg)  Height: 6\' 1"  (1.854 m)    Physical Exam  Constitutional: He appears well-developed.  HENT:  Head: Normocephalic and atraumatic.  Mouth/Throat: Oropharynx is clear and moist. No oropharyngeal exudate.  Neck: Normal range of motion.  Cardiovascular: Normal rate, regular rhythm and normal heart sounds.   Pulmonary/Chest: Effort normal and breath sounds normal.  Abdominal: Soft. Bowel sounds are normal. He exhibits no distension and no mass. There is no tenderness. There is no rebound and no guarding.  Musculoskeletal: He exhibits no edema or tenderness.  Neurological: He is alert.  Skin:  Skin is warm and dry.    Labs reviewed: Basic Metabolic Panel:  Recent Labs  05/08/16 0103 05/09/16 0348 05/16/16 0046 05/16/16 0054 06/07/16 06/13/16  NA 141 140 137 138 141 140  K 3.5 3.8 4.3 4.3 4.2 3.3*  CL 108 108 105 105  --   --   CO2 24 24 22   --   --   --   GLUCOSE 175* 215* 293* 280*  --   --  BUN 62* 48* 43* 42* 30* 34*  CREATININE 2.47* 2.05* 1.90* 1.90* 1.5* 1.9*  CALCIUM 9.8 9.1 9.6  --   --   --   MG  --   --  2.2  --   --   --    Liver Function Tests:  Recent Labs  01/21/16 1929 05/07/16 1211 05/16/16 0046 06/13/16  AST 29 28 32 35  ALT 28 33 36 28  ALKPHOS 114 92 96 92  BILITOT 0.5 0.7 0.5  --   PROT 8.6* 7.1 6.9  --   ALBUMIN 4.4 3.7 3.5  --     Recent Labs  01/21/16 1929 05/16/16 0046  LIPASE 19 16    Recent Labs  05/07/16 1211  AMMONIA 38*   CBC:  Recent Labs  01/21/16 1929 05/07/16 1211  05/09/16 0348 05/10/16 0819 05/16/16 0046 05/16/16 0054 06/07/16 06/12/16  WBC 5.3 5.1  < > 5.4 5.4 5.3  --  3.8 4.2  NEUTROABS 4.3 4.3  --   --   --  4.2  --   --   --   HGB 11.3* 12.2*  < > 11.5* 11.5* 11.7* 12.2* 9.3* 10.2*  HCT 34.9* 37.2*  < > 35.8* 34.7* 35.5* 36.0* 30* 33*  MCV 88.4 86.3  < > 88.6 86.8 87.9  --   --   --   PLT PLATELET CLUMPS NOTED ON SMEAR, UNABLE TO ESTIMATE 177  < > 164 136* 85*  --  143* 163  < > = values in this interval not displayed. TSH:  Recent Labs  05/07/16 1838  TSH 0.467   A1C: Lab Results  Component Value Date   HGBA1C 7.8 (H) 05/23/2012   Lipid Panel: No results for input(s): CHOL, HDL, LDLCALC, TRIG, CHOLHDL, LDLDIRECT in the last 8760 hours.  Assessment/Plan 1. Altered mental status, unspecified altered mental status type Pt now back to baseline.   2. DM (diabetes mellitus), secondary, uncontrolled, with peripheral vascular complications (HCC) Stable, will cont current regimen  3. Prostate cancer [facility]) Being followed by oncology; He is on oral chemotherapy agent & leuprolide  injections every 6 month for prostate cancer. part of the combination chemotherapy is prednisone 5 mg twice a day.  4. Chronic congestive heart failure, unspecified congestive heart failure type (HCC) Stable, conts on losartan, metoprolol, and lasix  5. Essential hypertension -stable, cont current regimen.   6. Paroxysmal atrial fibrillation (HCC) Rate controlled, not on anticoagulation due to high fall risk  pt is stable for discharge-will need PT/OT per home health. DME needed includes rolling walker, WC, 3n1 and shower bench. [facility] therefore no Rx written.  will need to follow up with PCP within 2 weeks.   Carlos American. Harle Battiest  [facility] [phone] 8 am - 5 pm) [phone] (after hours)

## 2016-07-03 ENCOUNTER — Telehealth: Payer: Self-pay | Admitting: Internal Medicine

## 2016-07-09 ENCOUNTER — Encounter: Payer: Self-pay | Admitting: Internal Medicine

## 2016-07-28 ENCOUNTER — Emergency Department (HOSPITAL_COMMUNITY): Payer: Non-veteran care

## 2016-07-28 ENCOUNTER — Emergency Department (HOSPITAL_COMMUNITY)
Admission: EM | Admit: 2016-07-28 | Discharge: 2016-07-29 | Disposition: A | Discharge status: Home / Self Care | Payer: Non-veteran care | Attending: Emergency Medicine | Admitting: Emergency Medicine

## 2016-07-28 ENCOUNTER — Encounter (HOSPITAL_COMMUNITY): Payer: Self-pay | Admitting: Emergency Medicine

## 2016-07-28 DIAGNOSIS — R42 Dizziness and giddiness: Secondary | ICD-10-CM | POA: Diagnosis present

## 2016-07-28 DIAGNOSIS — Z794 Long term (current) use of insulin: Secondary | ICD-10-CM | POA: Diagnosis not present

## 2016-07-28 DIAGNOSIS — Z7982 Long term (current) use of aspirin: Secondary | ICD-10-CM | POA: Diagnosis not present

## 2016-07-28 DIAGNOSIS — I130 Hypertensive heart and chronic kidney disease with heart failure and stage 1 through stage 4 chronic kidney disease, or unspecified chronic kidney disease: Secondary | ICD-10-CM | POA: Diagnosis not present

## 2016-07-28 DIAGNOSIS — E119 Type 2 diabetes mellitus without complications: Secondary | ICD-10-CM | POA: Diagnosis not present

## 2016-07-28 DIAGNOSIS — Z79899 Other long term (current) drug therapy: Secondary | ICD-10-CM | POA: Insufficient documentation

## 2016-07-28 DIAGNOSIS — I2510 Atherosclerotic heart disease of native coronary artery without angina pectoris: Secondary | ICD-10-CM | POA: Insufficient documentation

## 2016-07-28 DIAGNOSIS — Z8673 Personal history of transient ischemic attack (TIA), and cerebral infarction without residual deficits: Secondary | ICD-10-CM | POA: Insufficient documentation

## 2016-07-28 DIAGNOSIS — N183 Chronic kidney disease, stage 3 (moderate): Secondary | ICD-10-CM | POA: Diagnosis not present

## 2016-07-28 DIAGNOSIS — Z8546 Personal history of malignant neoplasm of prostate: Secondary | ICD-10-CM | POA: Diagnosis not present

## 2016-07-28 DIAGNOSIS — R531 Weakness: Secondary | ICD-10-CM | POA: Insufficient documentation

## 2016-07-28 DIAGNOSIS — I509 Heart failure, unspecified: Secondary | ICD-10-CM | POA: Insufficient documentation

## 2016-07-28 LAB — DIFFERENTIAL
BASOS ABS: 0 10*3/uL (ref 0.0–0.1)
BASOS PCT: 0 %
Eosinophils Absolute: 0 10*3/uL (ref 0.0–0.7)
Eosinophils Relative: 1 %
LYMPHS PCT: 15 %
Lymphs Abs: 1 10*3/uL (ref 0.7–4.0)
Monocytes Absolute: 0.4 10*3/uL (ref 0.1–1.0)
Monocytes Relative: 7 %
NEUTROS ABS: 4.8 10*3/uL (ref 1.7–7.7)
NEUTROS PCT: 77 %

## 2016-07-28 LAB — I-STAT CHEM 8, ED
BUN: 40 mg/dL — AB (ref 6–20)
CREATININE: 2 mg/dL — AB (ref 0.61–1.24)
Calcium, Ion: 1.15 mmol/L (ref 1.15–1.40)
Chloride: 100 mmol/L — ABNORMAL LOW (ref 101–111)
GLUCOSE: 220 mg/dL — AB (ref 65–99)
HEMATOCRIT: 44 % (ref 39.0–52.0)
Hemoglobin: 15 g/dL (ref 13.0–17.0)
POTASSIUM: 3.7 mmol/L (ref 3.5–5.1)
Sodium: 138 mmol/L (ref 135–145)
TCO2: 25 mmol/L (ref 0–100)

## 2016-07-28 LAB — PROTIME-INR
INR: 1.1
Prothrombin Time: 14.3 seconds (ref 11.4–15.2)

## 2016-07-28 LAB — CBC
HEMATOCRIT: 39.8 % (ref 39.0–52.0)
Hemoglobin: 13.3 g/dL (ref 13.0–17.0)
MCH: 29.4 pg (ref 26.0–34.0)
MCHC: 33.4 g/dL (ref 30.0–36.0)
MCV: 87.9 fL (ref 78.0–100.0)
Platelets: 166 10*3/uL (ref 150–400)
RBC: 4.53 MIL/uL (ref 4.22–5.81)
RDW: 13.2 % (ref 11.5–15.5)
WBC: 5.8 10*3/uL (ref 4.0–10.5)

## 2016-07-28 LAB — COMPREHENSIVE METABOLIC PANEL
ALT: 19 U/L (ref 17–63)
AST: 35 U/L (ref 15–41)
Albumin: 3.6 g/dL (ref 3.5–5.0)
Alkaline Phosphatase: 88 U/L (ref 38–126)
Anion gap: 11 (ref 5–15)
BUN: 40 mg/dL — AB (ref 6–20)
CHLORIDE: 100 mmol/L — AB (ref 101–111)
CO2: 26 mmol/L (ref 22–32)
CREATININE: 2.21 mg/dL — AB (ref 0.61–1.24)
Calcium: 9.9 mg/dL (ref 8.9–10.3)
GFR calc Af Amer: 33 mL/min — ABNORMAL LOW (ref >60)
GFR, EST NON AFRICAN AMERICAN: 28 mL/min — AB (ref >60)
Glucose, Bld: 218 mg/dL — ABNORMAL HIGH (ref 65–99)
POTASSIUM: 4.4 mmol/L (ref 3.5–5.1)
SODIUM: 137 mmol/L (ref 135–145)
Total Bilirubin: 1 mg/dL (ref 0.3–1.2)
Total Protein: 7.4 g/dL (ref 6.5–8.1)

## 2016-07-28 LAB — I-STAT TROPONIN, ED: TROPONIN I, POC: 0.03 ng/mL (ref 0.00–0.08)

## 2016-07-28 LAB — CBG MONITORING, ED
GLUCOSE-CAPILLARY: 224 mg/dL — AB (ref 65–99)
GLUCOSE-CAPILLARY: 395 mg/dL — AB (ref 65–99)
Glucose-Capillary: 216 mg/dL — ABNORMAL HIGH (ref 65–99)

## 2016-07-28 LAB — APTT: APTT: 24 s (ref 24–36)

## 2016-07-28 LAB — ETHANOL: Alcohol, Ethyl (B): <5 mg/dL (ref <5)

## 2016-07-28 MED ORDER — METOPROLOL SUCCINATE ER 25 MG PO TB24
200.0000 mg | ORAL_TABLET | Freq: Every morning | ORAL | Status: DC
  Administered 2016-07-28: 200 mg via ORAL
  Filled 2016-07-28: qty 2

## 2016-07-28 MED ORDER — CLONIDINE HCL 0.2 MG PO TABS
0.1000 mg | ORAL_TABLET | Freq: Every day | ORAL | Status: DC
  Administered 2016-07-28: 0.1 mg via ORAL
  Filled 2016-07-28 (×2): qty 1

## 2016-07-28 MED ORDER — SENNA 8.6 MG PO TABS
1.0000 | ORAL_TABLET | Freq: Every day | ORAL | Status: DC
  Administered 2016-07-29: 8.6 mg via ORAL
  Filled 2016-07-28 (×2): qty 1

## 2016-07-28 MED ORDER — ABIRATERONE ACETATE 250 MG PO TABS: 1000.0000 mg | ORAL_TABLET | ORAL | Status: DC

## 2016-07-28 MED ORDER — LATANOPROST 0.005 % OP SOLN
1.0000 [drp] | Freq: Every day | OPHTHALMIC | Status: DC
  Administered 2016-07-28: 1 [drp] via OPHTHALMIC
  Filled 2016-07-28: qty 2.5

## 2016-07-28 MED ORDER — DOCUSATE SODIUM 100 MG PO CAPS
100.0000 mg | ORAL_CAPSULE | Freq: Two times a day (BID) | ORAL | Status: DC
  Administered 2016-07-28 – 2016-07-29 (×2): 100 mg via ORAL
  Filled 2016-07-28 (×2): qty 1

## 2016-07-28 MED ORDER — INSULIN ASPART 100 UNIT/ML ~~LOC~~ SOLN
8.0000 [IU] | Freq: Three times a day (TID) | SUBCUTANEOUS | Status: DC
  Administered 2016-07-28 – 2016-07-29 (×3): 8 [IU] via SUBCUTANEOUS
  Filled 2016-07-28 (×3): qty 1

## 2016-07-28 MED ORDER — ATORVASTATIN CALCIUM 80 MG PO TABS
80.0000 mg | ORAL_TABLET | Freq: Every day | ORAL | Status: DC
  Administered 2016-07-28: 80 mg via ORAL
  Filled 2016-07-28: qty 1

## 2016-07-28 MED ORDER — BRIMONIDINE TARTRATE 0.2 % OP SOLN
1.0000 [drp] | Freq: Every day | OPHTHALMIC | Status: DC
  Filled 2016-07-28: qty 5

## 2016-07-28 MED ORDER — FUROSEMIDE 20 MG PO TABS
20.0000 mg | ORAL_TABLET | Freq: Every day | ORAL | Status: DC
  Administered 2016-07-28: 20 mg via ORAL
  Filled 2016-07-28 (×2): qty 1

## 2016-07-28 MED ORDER — BRIMONIDINE TARTRATE 0.2 % OP SOLN
1.0000 [drp] | Freq: Three times a day (TID) | OPHTHALMIC | Status: DC
  Administered 2016-07-28 – 2016-07-29 (×2): 1 [drp] via OPHTHALMIC
  Filled 2016-07-28 (×2): qty 5

## 2016-07-28 MED ORDER — DORZOLAMIDE HCL-TIMOLOL MAL 2-0.5 % OP SOLN: 1.0000 [drp] | Freq: Every day | OPHTHALMIC | Status: DC

## 2016-07-28 MED ORDER — SPIRONOLACTONE 25 MG PO TABS
50.0000 mg | ORAL_TABLET | Freq: Two times a day (BID) | ORAL | Status: DC
Start: 2016-07-28 — End: 2016-07-29
  Administered 2016-07-28: 50 mg via ORAL
  Filled 2016-07-28: qty 1
  Filled 2016-07-28: qty 2

## 2016-07-28 MED ORDER — INSULIN GLARGINE 100 UNIT/ML ~~LOC~~ SOLN
35.0000 [IU] | Freq: Every day | SUBCUTANEOUS | Status: DC
  Administered 2016-07-28: 35 [IU] via SUBCUTANEOUS
  Filled 2016-07-28: qty 0.35

## 2016-07-28 MED ORDER — ASPIRIN 81 MG PO CHEW
81.0000 mg | CHEWABLE_TABLET | Freq: Every morning | ORAL | Status: DC
  Administered 2016-07-28 – 2016-07-29 (×2): 81 mg via ORAL
  Filled 2016-07-28 (×3): qty 1

## 2016-07-28 MED ORDER — LEUPROLIDE ACETATE (6 MONTH) 45 MG ~~LOC~~ KIT: 45.0000 mg | PACK | SUBCUTANEOUS | Status: DC

## 2016-07-28 MED ORDER — LOSARTAN POTASSIUM 50 MG PO TABS: 100.0000 mg | ORAL_TABLET | Freq: Every morning | ORAL | Status: DC

## 2016-07-28 MED ORDER — DORZOLAMIDE HCL 2 % OP SOLN
1.0000 [drp] | Freq: Every day | OPHTHALMIC | Status: DC
  Administered 2016-07-28 – 2016-07-29 (×2): 1 [drp] via OPHTHALMIC
  Filled 2016-07-28 (×2): qty 10

## 2016-07-28 MED ORDER — AMLODIPINE BESYLATE 5 MG PO TABS
10.0000 mg | ORAL_TABLET | Freq: Every morning | ORAL | Status: DC
  Administered 2016-07-28: 10 mg via ORAL
  Filled 2016-07-28 (×2): qty 2

## 2016-07-28 MED ORDER — CARBAMAZEPINE ER 200 MG PO TB12
600.0000 mg | ORAL_TABLET | Freq: Two times a day (BID) | ORAL | Status: DC
  Administered 2016-07-28: 600 mg via ORAL
  Filled 2016-07-28 (×2): qty 3

## 2016-07-28 MED ORDER — HYDRALAZINE HCL 50 MG PO TABS
100.0000 mg | ORAL_TABLET | Freq: Three times a day (TID) | ORAL | Status: DC
Start: 2016-07-28 — End: 2016-07-29
  Administered 2016-07-28: 100 mg via ORAL
  Filled 2016-07-28: qty 2

## 2016-07-28 NOTE — Consult Note (Signed)
Clinical Social Worker spoke with patient spouse at bedside and patient daughter over the phone.  Patient is not at baseline according to family.  Patient family was hopeful for SNF placement through patient VA benefit, however no one available at the [place] until Tuesday.  CSW explained that patient does not require a hospital admission, therefore would be a discharge from the emergency room.  CSW offered patient family SNF placement under private pay, however they have declined.  Patient family requested transfer to [facility] emergency room, per CM, there is no [facility] ED.  Patient daughter states that they are unable to care for patient home.  Patient has previously been to [place], but does not have a qualifying stay to return.  Patient daughter advised to take patient home and call [facility] on Tuesday to request resources - patient daughter unhappy and unwilling to bring patient home at this time.  CSW updated CM.  CSW available for support as needed.  Barbette Or, [place]

## 2016-07-28 NOTE — Care Management Note (Addendum)
Case Management Note  Patient Details  Name: Collin Fox MRN: [id number] Date of Birth: 1945-07-12  Subjective/Objective:  71 y.o. M referred to [facility] by Vanita Panda, [place]. Pt is for "placement". CM made referral to [place], [place] and placed PT consult for recommendations.  Last admission 11/28 thru 12/1 discharged to [facility]                   Action/Plan: Will continue to follow.    Expected Discharge Date:                  Expected Discharge Plan:     In-House Referral:  Clinical Social Work  Discharge planning Services  CM Consult  Post Acute Care Choice:    Choice offered to:     DME Arranged:    DME Agency:     HH Arranged:    HH Agency:     Status of Service:  In process, will continue to follow  If discussed at Long Length of Stay Meetings, dates discussed:    Additional Comments:  Delrae Sawyers, RN 07/28/2016, 9:16 AM

## 2016-07-28 NOTE — ED Triage Notes (Signed)
Pt presents via EMS. C/O of dizziness since Wednesday. Last seen normal Wednesday hx of stroke with right side deficits. Pt A+Ox1 baseline per family but family states he is more confused than normal

## 2016-07-28 NOTE — ED Notes (Signed)
Family reports that they have been in contact with the [place] for nursing home placement and needs pt to be admitted to hospital until Tuesday. Tuesday is when she can get back in contact with the [place].

## 2016-07-28 NOTE — ED Provider Notes (Signed)
Blood pressure 159/89, pulse 69, temperature 98.7 F (37.1 C), temperature source Oral, resp. rate 20, SpO2 100 %.  Assuming care from Dr. Vanita Panda.  In short, Collin Fox is a 71 y.o. male with a chief complaint of Altered Mental Status; Weakness; and Dizziness .  Refer to the original H&P for additional details.  The current plan of care is to follow up on case manager recommendation.  06:28 PM Patient's family is under the impression that the patient will be discharged after talking with the Education officer, museum. I was asked to update the family regarding their situation. They are very concerned that the patient is unable to walk and needs placement. They're adamant that he cannot return home at this time given his current condition. No clear indication for inpatient admission. The patient and family have plans to transfer to the [place] but this does not open until Tuesday. I discussed this with the patient's daughter by phone and family members at bedside. Initially they told the social worker that they were not interested in private pay placement but given the transfer to the [place] is not a possibility and there is no indication for admission for qualifying stay they are now interested in private pay placement. Patient was recently at [facility].   Attempted to re-page CM and SW to update them. CM note is pending with plan to see the patient in the AM to discuss temporary private pay placement. I updated the family on the expected time course. They verbalize understanding.   Nanda Quinton, MD    Margette Fast, MD 07/28/16 [phone]

## 2016-07-28 NOTE — ED Provider Notes (Signed)
[facility] DEPT Provider Note   CSN: [id number] Arrival date & time: 07/28/16  [id number]     History   Chief Complaint Chief Complaint  Patient presents with  . Altered Mental Status  . Weakness  . Dizziness    HPI Collin Fox is a 71 y.o. male.  HPI  Level V caveat secondary to patient's baseline disorientation. This patient with a history of prior strokes, baseline right-sided deficiencies presents with dizziness. The patient himself cannot provide many details of history of present illness, states that he is dizzy when he is asked what is wrong. Patient draws a circle around his head, repeat or dizzy, seems to deny pain, denies nausea, states that he is comfortable when asked specifically. Only with specific question as the patient provide brief verbal responses, and those are intermittent. Per report the patient was last "normal" 4 days ago. Family is not present to assist with the history of present illness.   Past Medical History:  Diagnosis Date  . Anemia   . Anxiety   . Atrial fibrillation ([place])   . CHF (congestive heart failure) ([place])   . Chronic kidney disease    "don't know what stage" (05/07/2016)  . Coronary artery disease   . Heart attack ~ 1994/1995  . High cholesterol   . Hypertension   . Prostate cancer ([place])   . Seizure-like activity ([place])    "right hand; takes seizure RX" (05/07/2016)  . Stroke [facility]) ~ 1994/1995   "took his speech; right side weaker since; cognitive skills impaired" (05/07/2016)  . Type II diabetes mellitus [facility])     Patient Active Problem List   Diagnosis Date Noted  . Chronic constipation 06/13/2016  . Adult failure to thrive 06/11/2016  . Anemia 06/11/2016  . Prostate cancer ([place]) 05/14/2016  . DM (diabetes mellitus), secondary, uncontrolled, with peripheral vascular complications ([place]) [id number]  . Encephalopathy 05/07/2016  . Altered mental state 05/07/2016  . AKI (acute kidney injury) ([place])   . Stage 3 chronic  kidney disease   . Paroxysmal atrial fibrillation ([facility])   . Coronary artery disease involving native coronary artery of native heart without angina pectoris   . Stroke ([place]) 11/13/2014  . Essential hypertension 05/24/2012  . CAP (community acquired pneumonia) 05/23/2012  . Chronic congestive heart failure ([place]) 05/23/2012  . Renal insufficiency 05/23/2012  . Sepsis ([place]) 05/23/2012    Past Surgical History:  Procedure Laterality Date  . CARDIAC CATHETERIZATION    . CATARACT EXTRACTION, BILATERAL Bilateral   . CORONARY ARTERY BYPASS GRAFT  ~ 1994/1995  . PROSTATE BIOPSY         Home Medications    Prior to Admission medications   Medication Sig Start Date End Date Taking? Authorizing Provider  abiraterone Acetate (ZYTIGA) 250 MG tablet Take 1,000 mg by mouth every morning. Take on an empty stomach 1 hour before or 2 hours after a meal    Historical Provider, MD  amLODipine (NORVASC) 10 MG tablet Take 10 mg by mouth every morning.    Historical Provider, MD  ammonium lactate (LAC-HYDRIN) 12 % lotion Apply 1 application topically daily.    Historical Provider, MD  aspirin 81 MG chewable tablet Chew 81 mg by mouth every morning.    Historical Provider, MD  atorvastatin (LIPITOR) 80 MG tablet Take 80 mg by mouth daily at 6 PM.    Historical Provider, MD  brimonidine (ALPHAGAN) 0.2 % ophthalmic solution Place 1 drop into both eyes 3 (three) times daily.  Historical Provider, MD  carbamazepine (CARBATROL) 300 MG 12 hr capsule Take 600 mg by mouth 2 (two) times daily.     Historical Provider, MD  cloNIDine (CATAPRES) 0.1 MG tablet Take 0.1 mg by mouth daily.     Historical Provider, MD  colchicine 0.6 MG tablet Give two capsules by mouth daily as needed for gout flare. Repeat one capsule in one hour as needed. Do not repeat for at least a week.    Historical Provider, MD  Diclofenac Sodium 1 % CREA Place 1 application onto the skin 4 (four) times daily as needed. To right large toe  08/06/15   Shawnee Knapp, MD  docusate sodium (COLACE) 100 MG capsule Take 100 mg by mouth 2 (two) times daily. For constipation    Historical Provider, MD  dorzolamide-timolol (COSOPT) 22.3-6.8 MG/ML ophthalmic solution Place 1 drop into both eyes daily.    Historical Provider, MD  furosemide (LASIX) 20 MG tablet Take 20 mg by mouth daily.    Historical Provider, MD  hydrALAZINE (APRESOLINE) 100 MG tablet Take 100 mg by mouth 3 (three) times daily.    Historical Provider, MD  insulin aspart (NOVOLOG) 100 UNIT/ML injection Inject 8 Units into the skin 3 (three) times daily with meals.     Historical Provider, MD  insulin glargine (LANTUS) 100 UNIT/ML injection Inject 35 Units into the skin at bedtime.    Historical Provider, MD  latanoprost (XALATAN) 0.005 % ophthalmic solution Place 1 drop into both eyes at bedtime.    Historical Provider, MD  leuprolide, 6 Month, (ELIGARD) 45 MG injection Inject 45 mg into the skin every 6 (six) months.    Historical Provider, MD  losartan (COZAAR) 100 MG tablet Take 100 mg by mouth every morning.    Historical Provider, MD  metoprolol (TOPROL-XL) 200 MG 24 hr tablet Take 200 mg by mouth every morning.    Historical Provider, MD  senna (SENOKOT) 8.6 MG tablet Take 1 tablet by mouth daily.    Historical Provider, MD  spironolactone (ALDACTONE) 25 MG tablet Take 50 mg by mouth 2 (two) times daily.    Historical Provider, MD  UNABLE TO FIND Med Name: Med Pass 120 mL daily    Historical Provider, MD    Family History Family History  Problem Relation Age of Onset  . Diabetes Brother   . Heart disease Brother   . Hypertension Brother     Social History Social History  Substance Use Topics  . Smoking status: Never Smoker  . Smokeless tobacco: Never Used  . Alcohol use No     Allergies   Patient has no known allergies.   Review of Systems Review of Systems  Unable to perform ROS: Other  Patient is oriented only to self, offers only intermittent verbal  replies, inconsistently.   Physical Exam Updated Vital Signs BP 119/72   Pulse 89   Temp 98.7 F (37.1 C) (Oral)   Resp (!) 30   SpO2 100%   Physical Exam  Constitutional: He has a sickly appearance. No distress.  HENT:  Head: Normocephalic and atraumatic.  Eyes: Conjunctivae and EOM are normal.  Cardiovascular: Normal rate and regular rhythm.   Pulmonary/Chest: Effort normal. No stridor. No respiratory distress.  Abdominal: He exhibits no distension.  Musculoskeletal: He exhibits no edema.  Neurological: He is alert. He is disoriented. He displays atrophy.  Disoriented, but awake and alert. Patient follows neurologic evaluation commands only intermittently, but does move all extremity spontaneously. Atrophy notable  in all extremities. The patient describes dizziness, drawing a circle around the top of his head with his finger, he also moves his head freely in all dimensions during the evaluation.  Skin: Skin is warm and dry.  Psychiatric: He has a normal mood and affect.  Nursing note and vitals reviewed.    ED Treatments / Results  Labs (all labs ordered are listed, but only abnormal results are displayed) Labs Reviewed  COMPREHENSIVE METABOLIC PANEL - Abnormal; Notable for the following:       Result Value   Chloride 100 (*)    Glucose, Bld 218 (*)    BUN 40 (*)    Creatinine, Ser 2.21 (*)    GFR calc non Af Amer 28 (*)    GFR calc Af Amer 33 (*)    All other components within normal limits  CBG MONITORING, ED - Abnormal; Notable for the following:    Glucose-Capillary 216 (*)    All other components within normal limits  I-STAT CHEM 8, ED - Abnormal; Notable for the following:    Chloride 100 (*)    BUN 40 (*)    Creatinine, Ser 2.00 (*)    Glucose, Bld 220 (*)    All other components within normal limits  CBC  ETHANOL  PROTIME-INR  APTT  DIFFERENTIAL  RAPID URINE DRUG SCREEN, HOSP PERFORMED  URINALYSIS, ROUTINE W REFLEX MICROSCOPIC  I-STAT TROPOININ,  ED    EKG  EKG Interpretation  Date/Time:  Sunday July 28 2016 06:38:02 EST Ventricular Rate:  85 PR Interval:    QRS Duration: 112 QT Interval:  402 QTC Calculation: 478 R Axis:   -33 Text Interpretation:  Atrial fibrillation LVH with secondary repolarization abnormality Inferior infarct, old When compared with ECG of 05/16/2016, No significant change was found Confirmed by [facility]  MD, DAVID ([id number]) on 07/28/2016 6:47:55 AM       Radiology Ct Head Wo Contrast  Result Date: 07/28/2016 CLINICAL DATA:  Dizziness, decreased cognition, disorientation, remote stroke, atrial fibrillation, hypertension, coronary artery disease post MI, type II diabetes mellitus, prostate cancer EXAM: CT HEAD WITHOUT CONTRAST TECHNIQUE: Contiguous axial images were obtained from the base of the skull through the vertex without intravenous contrast. COMPARISON:  12/2015 FINDINGS: Brain: Generalized atrophy. Stable ventricular morphology. No midline shift or mass effect. Large old LEFT MCA territory infarct. Small old parasagittal RIGHT frontal lobe infarct in ACA distribution. No intracranial hemorrhage, mass lesion, or evidence of acute infarction. Small vessel chronic ischemic changes of deep cerebral white matter. No extra-axial fluid collections. Vascular: Atherosclerotic calcification of internal carotid and vertebral arteries at skullbase. Skull: Intact Sinuses/Orbits: Clear Other: N/A IMPRESSION: Atrophy with small vessel chronic ischemic changes of deep cerebral white matter. Large old LEFT MCA and small bold RIGHT ACA territory infarcts. No acute intracranial abnormalities. Electronically Signed   By: Lavonia Dana M.D.   On: 07/28/2016 08:30    Procedures Procedures (including critical care time)  Medications Ordered in ED Medications - No data to display  12:01 PM Family now present. He states that the patient has had similar episodes over the past 2 years, typically occurring every few months. He  states that this is a typical episode, during which the patient states that he is dizzy, is essentially non-participatory, unwilling to walk. After his last event patient spent a few days in a rehabilitation facility before transferring home 2 weeks ago. Note that soon after arrival home the patient was back to baseline, but now, for the  past few days the patient has been non-participatory, including not ambulatory. Initial Impression / Assessment and Plan / ED Course  I have reviewed the triage vital signs and the nursing notes.  Pertinent labs & imaging results that were available during my care of the patient were reviewed by me and considered in my medical decision making (see chart for details).  Repeat exam the patient is in similar condition.  Labs now available, results  consistent with mild dehydration, otherwise not notable.  Family states the patient is unable to be cared for at home, requests placement.  I discussed the patient's case with our case management team, and they will evaluate the patient.  No identified medical reason for admission.  Final Clinical Impressions(s) / ED Diagnoses  Dizziness Nonambulatory    Carmin Muskrat, MD 07/29/16 1006

## 2016-07-29 LAB — CBG MONITORING, ED
GLUCOSE-CAPILLARY: 181 mg/dL — AB (ref 65–99)
Glucose-Capillary: 250 mg/dL — ABNORMAL HIGH (ref 65–99)

## 2016-07-29 MED ORDER — SODIUM CHLORIDE 0.9 % IV BOLUS (SEPSIS)
1000.0000 mL | Freq: Once | INTRAVENOUS | Status: AC
  Administered 2016-07-29: 1000 mL via INTRAVENOUS

## 2016-07-29 NOTE — Progress Notes (Signed)
CSW engaged with Patient and his wife at bedside along with Patient's daughter via T/C. Patient's daughter reports that the plan is now for Patient to discharge home. She reports that she contacted [facility] who did confirm that they require 30 days of payment at time of admission. Patient's daughter provided Patient's wife with T/C for Patient's next door neighbor for assistance with getting Patient into the home. CSW assisted Patient's wife with calling neighbor who confirmed that he will assist with getting Patient in the home but notes that he is running a few errands and will plan to be home by 1PM. Patient's neighbor Zenia Resides) to call CSW back once he has returned home. Patient's wife reports that she will stay with Patient until CSW receives phone call from neighbor. Patient's family in agreement. RN updated on discharge plan.    Lorrine Kin, MSW, LCSW [facility] ED/61M Clinical Social Worker [phone]

## 2016-07-29 NOTE — ED Triage Notes (Signed)
Plan to take PT home . Wife will transport and a neighbor has agreed to help Pt into home.

## 2016-07-29 NOTE — ED Notes (Signed)
Patient ate 75% of Breakfast and drink.

## 2016-07-29 NOTE — Progress Notes (Signed)
PT Cancellation Note  Patient Details Name: Collin Fox MRN: [id number] DOB: 1945-09-09   Cancelled Treatment:    Reason Eval/Treat Not Completed: PT screened, no needs identified, will sign off.  Spoke with RN who indicates plan is for pt to return to home with continued care from family and HHPT as they did before admit.  RN indicates no acute PT needs at this time.  Will sign off.     [name] 07/29/2016, 1:20 PM

## 2016-07-29 NOTE — ED Notes (Signed)
Noted to be less alert than earlier in the shift.  Appears the same as he did on arrival to the ED.  Check CBG which was 181.  BP noted to be 89/55.  Dr Claudine Mouton notified.  IV in right arm infiltrated while flushing.  Removed and restarted per protocol.  #20 inserted in right hand/wrist.  Tolerated well.

## 2016-07-29 NOTE — ED Triage Notes (Signed)
Pt assisted with meal.

## 2016-07-29 NOTE — ED Triage Notes (Signed)
Pt left with wife . Staff member went with Pt to assist into car.

## 2016-07-29 NOTE — Progress Notes (Signed)
CSW engaged with Patient's daughter re: placement options. CSW reiterated that as explained by CSW on yesterday, Patient does not have a qualifying inpatient stay at this time for SNF insurance cover. Patient/family would have to pay privately for SNF placement. Patient's family reports that she was initially interested in private pay placement, however, was not aware that SNF facilities require 30 days of payment at time of admission. Patient's daughter reports inability to pay privately at this time. Patient's daughter reports that she would like Patient transferred to the [facility] but reports that she does not feel comfortable putting Patient in her car due to [place]. Per previous notes from [place] and MD, the [place] does not open until tomorrow due to the holiday (Presidents Day). Patient's daughter confirms that Patient does have home health but reports that they would need more assistance. CSW discussed private duty nursing agencies. CSW to staff w/ RN Case Manager for further options.    Lorrine Kin, MSW, LCSW [facility] ED/49M Clinical Social Worker [phone]

## 2016-07-29 NOTE — Progress Notes (Signed)
CSW attempted Patient's daughter via T/C. HIPAA compliant voice message left requesting return phone call.    Lorrine Kin, MSW, LCSW [facility] ED/38M Clinical Social Worker [phone]

## 2016-07-29 NOTE — ED Triage Notes (Signed)
Pt wife here to see PT.

## 2016-08-21 ENCOUNTER — Encounter: Payer: Self-pay | Admitting: *Deleted

## 2016-08-28 ENCOUNTER — Ambulatory Visit: Payer: Medicare Other | Admitting: Internal Medicine

## 2018-01-22 IMAGING — CT CT HEAD W/O CM
3 of 4 series · 18 of 47 positions shown, 21 images · non-contrast
Comparison: 01/21/2016

CLINICAL DATA: Altered mental status since yesterday

EXAM:
CT HEAD WITHOUT CONTRAST
TECHNIQUE: Contiguous axial images were obtained from the base of the skull
through the vertex without intravenous contrast.

[Series 201: head w/o, idose (1) · axial · non-contrast · 0.41mm/px · z∈[+51,+181]mm · 12 of 32 slices shown, 15 images]
[im 3/32  brain]
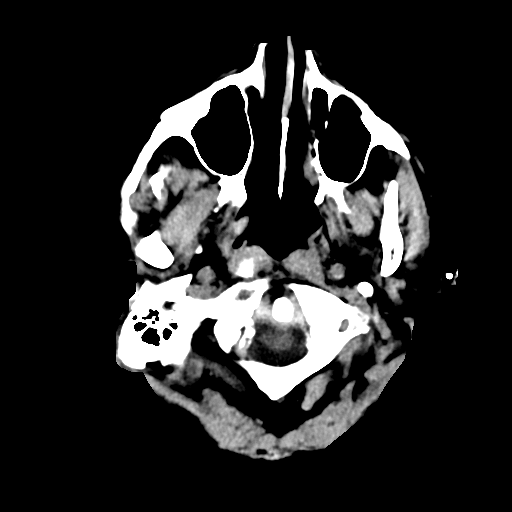
[im 3/32  bone]
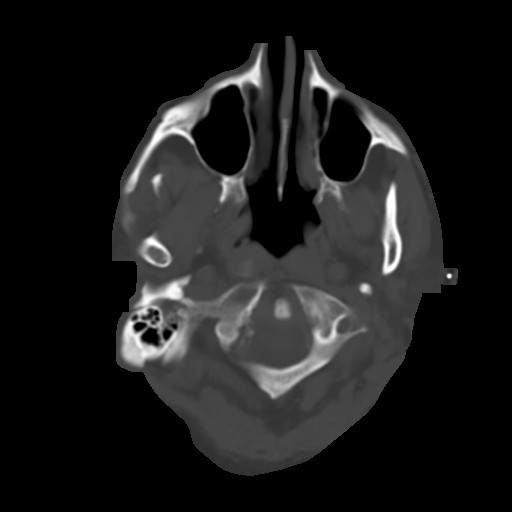
[im 5/32  brain]
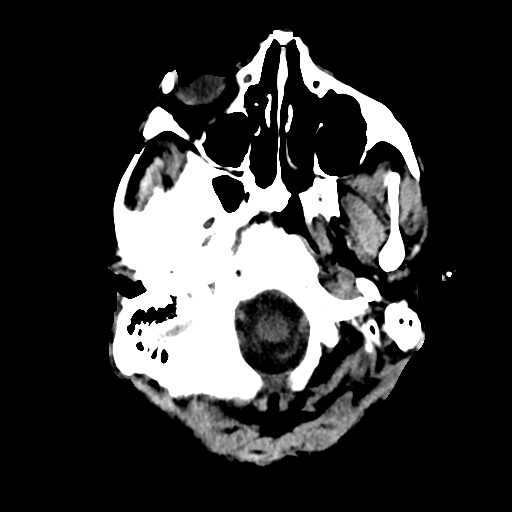
[im 7/32  brain]
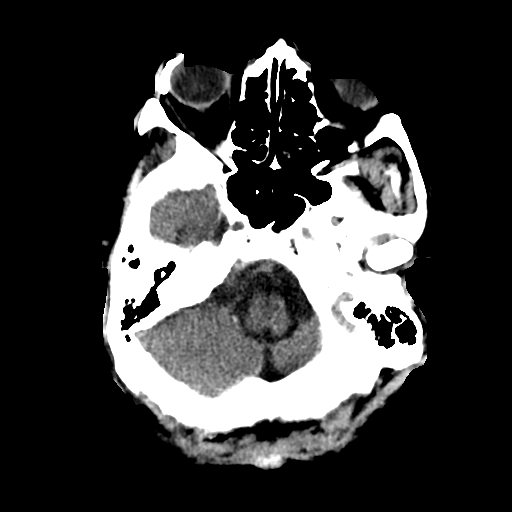
[im 9/32  brain]
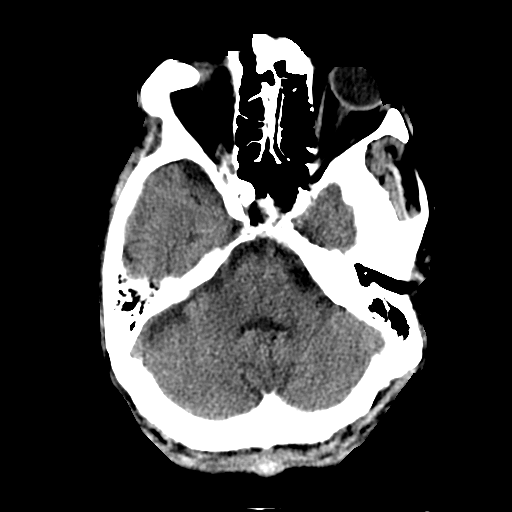
[im 12/32  brain]
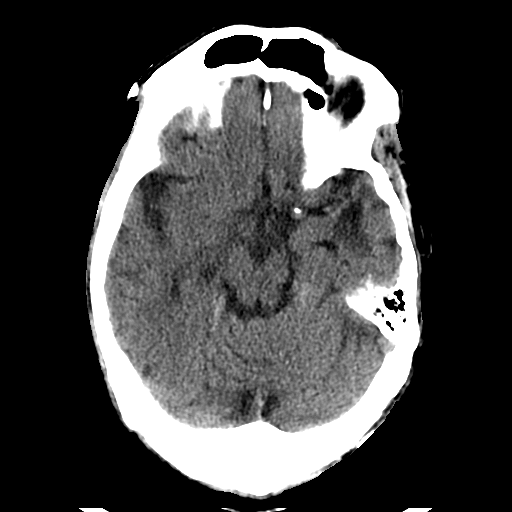
[im 12/32  bone]
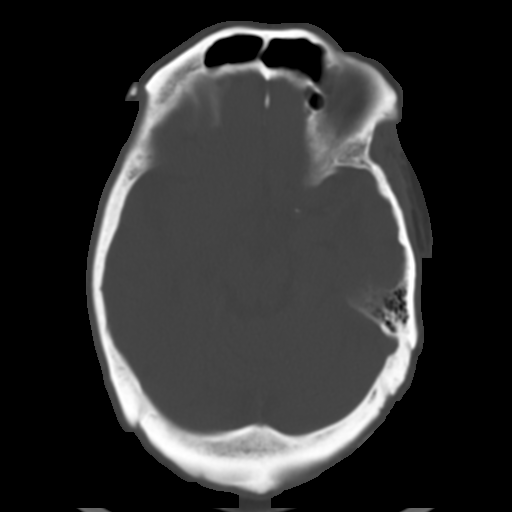
[im 14/32  brain]
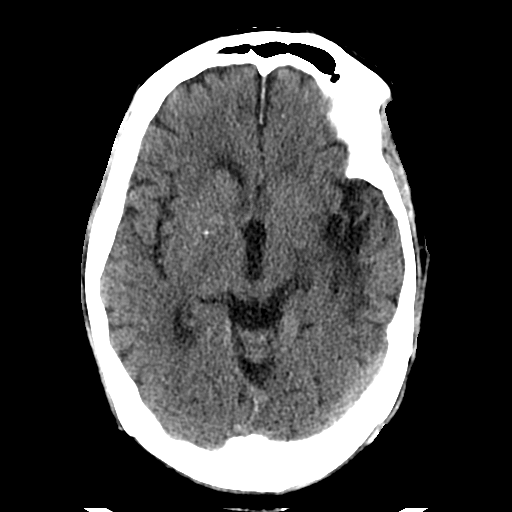
[im 18/32  brain]
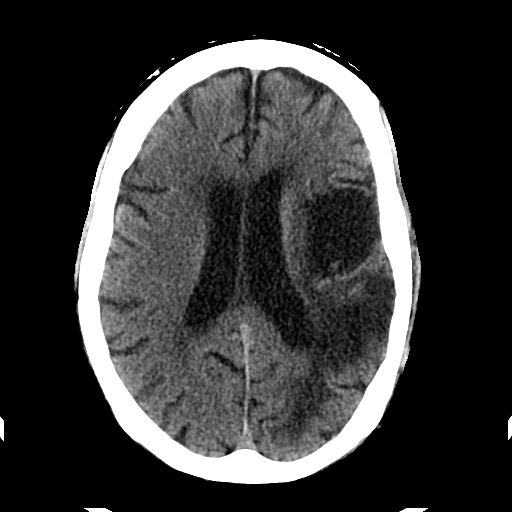
[im 20/32  brain]
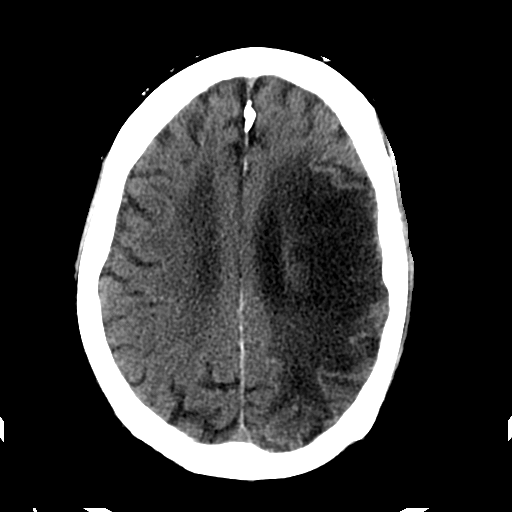
[im 23/32  brain]
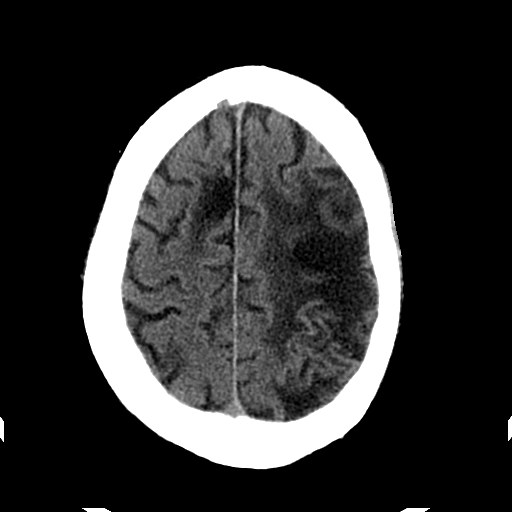
[im 23/32  bone]
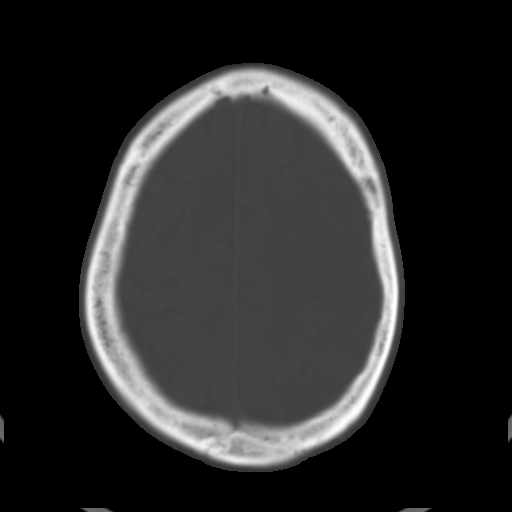
[im 25/32  brain]
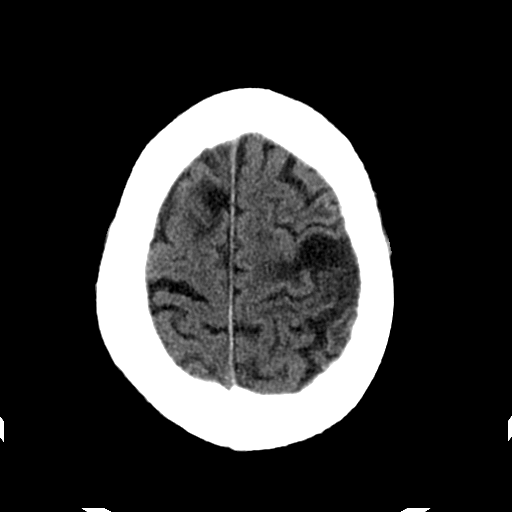
[im 27/32  brain]
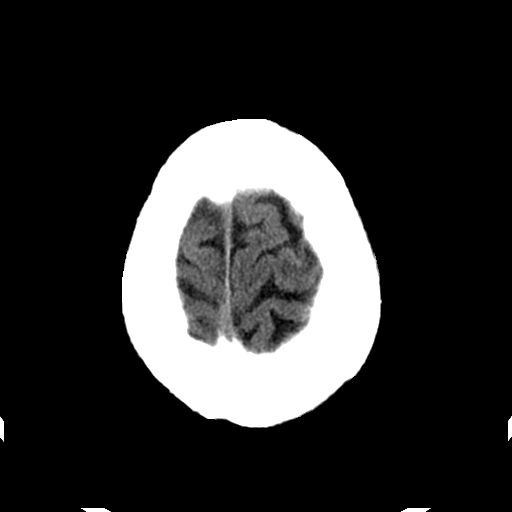
[im 29/32  brain]
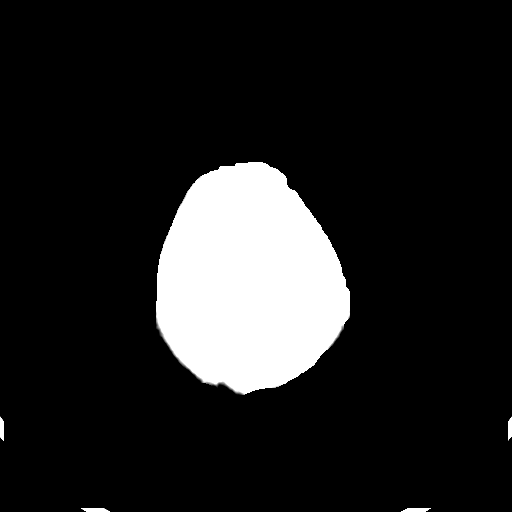

[Series 203: coronal st, idose (1) · coronal · 0.40mm/px · 3 of 70 slices shown]
[im 24/70  brain]
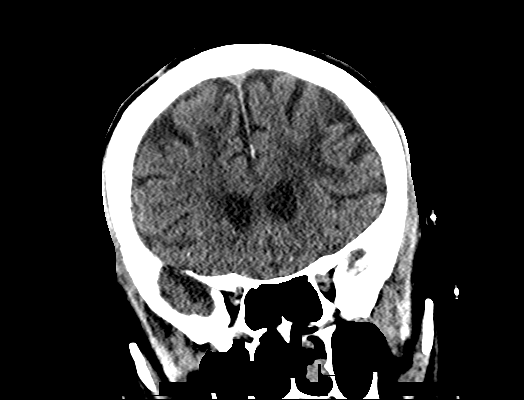
[im 31/70  brain]
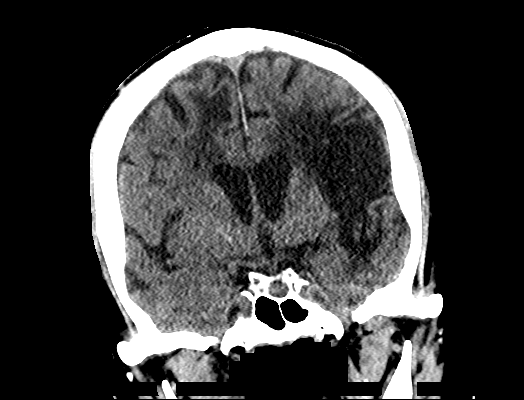
[im 39/70  brain]
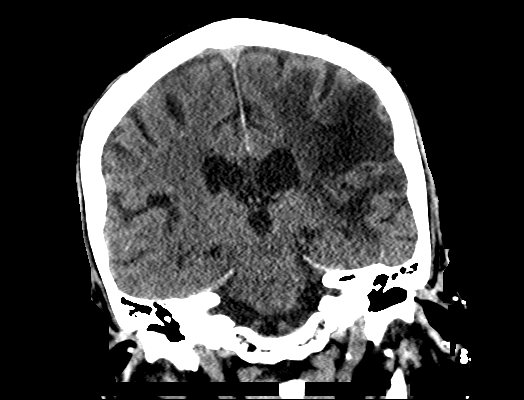

[Series 204: sagittal st, idose (1) · sagittal · 0.40mm/px · 3 of 70 slices shown]
[im 24/70  brain]
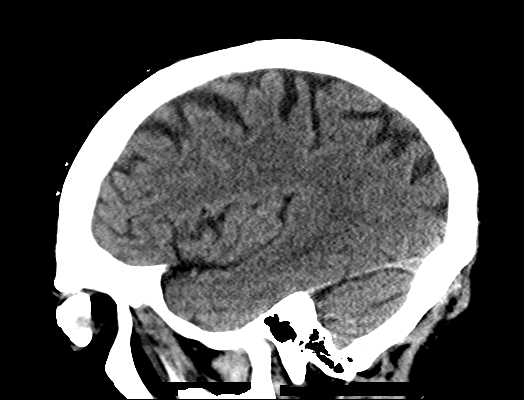
[im 35/70  brain]
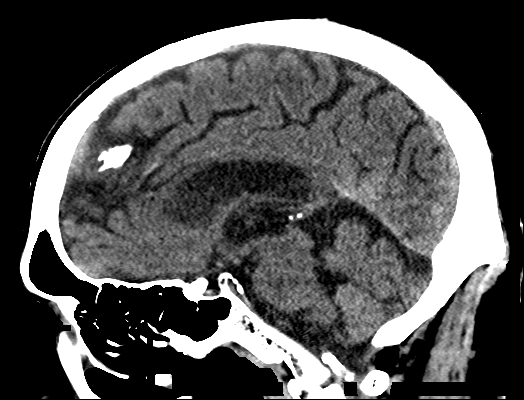
[im 47/70  brain]
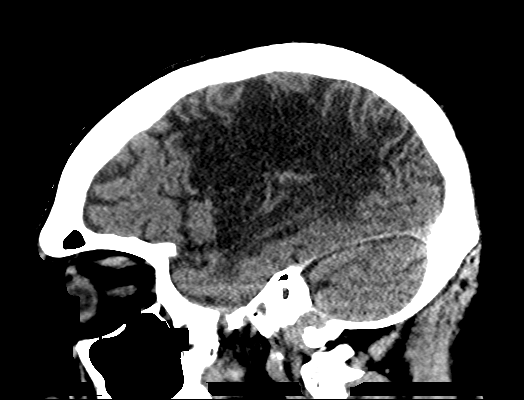

[18 of 47 positions shown; findings below may reference images not displayed]

FINDINGS: Brain: There is extensive encephalomalacia of the left parietal,
anterior temporal, and posterior frontal lobes. This is a stable
finding. There is also encephalomalacia in the medial right frontal
lobe. There is no mass effect. There is no acute intracranial
hemorrhage. Chronic ischemic changes in the periventricular white
matter are superimposed.

Vascular: No hyperdense vessel or unexpected calcification.

Skull: Intact.

Sinuses/Orbits: Mastoid air cells are clear. Visualized paranasal
sinuses are clear. Prosthetic device is associated with the right
globe. This is unchanged in position.

Other: None.
IMPRESSION: Chronic ischemic changes.  No acute intracranial pathology.

## 2019-12-09 DEATH — deceased
